# Patient Record
Sex: Female | Born: 1950
Health system: Southern US, Community
[De-identification: ages and names within clinical notes are randomized; demographics above are authoritative.]

## PROBLEM LIST (undated history)

## (undated) DIAGNOSIS — E119 Type 2 diabetes mellitus without complications: Secondary | ICD-10-CM

## (undated) HISTORY — DX: Type 2 diabetes mellitus without complications: E11.9

## (undated) HISTORY — PX: ANKLE SURGERY: SHX546

---

## 2000-01-13 ENCOUNTER — Emergency Department (HOSPITAL_COMMUNITY): Admission: EM | Admit: 2000-01-13 | Discharge: 2000-01-13 | Payer: Self-pay | Admitting: Emergency Medicine

## 2002-08-08 ENCOUNTER — Emergency Department (HOSPITAL_COMMUNITY): Admission: EM | Admit: 2002-08-08 | Discharge: 2002-08-08 | Payer: Self-pay | Admitting: Emergency Medicine

## 2003-06-05 ENCOUNTER — Emergency Department (HOSPITAL_COMMUNITY): Admission: EM | Admit: 2003-06-05 | Discharge: 2003-06-05 | Payer: Self-pay | Admitting: Emergency Medicine

## 2007-06-17 ENCOUNTER — Emergency Department (HOSPITAL_COMMUNITY): Admission: EM | Admit: 2007-06-17 | Discharge: 2007-06-17 | Payer: Self-pay | Admitting: Emergency Medicine

## 2009-01-04 ENCOUNTER — Ambulatory Visit: Payer: Self-pay | Admitting: Family Medicine

## 2010-09-06 ENCOUNTER — Ambulatory Visit: Payer: Self-pay | Admitting: Family Medicine

## 2013-07-08 ENCOUNTER — Ambulatory Visit: Payer: Self-pay | Admitting: Family Medicine

## 2013-09-02 ENCOUNTER — Ambulatory Visit: Payer: Self-pay | Admitting: Family Medicine

## 2014-04-21 LAB — HEMOGLOBIN A1C: Hemoglobin A1C: 7.2

## 2014-04-21 LAB — CBC AND DIFFERENTIAL
HEMATOCRIT: 41 % (ref 36–46)
HEMOGLOBIN: 13.4 g/dL (ref 12.0–16.0)
NEUTROS ABS: 36 /uL
PLATELETS: 186 10*3/uL (ref 150–399)
WBC: 10 10^3/mL

## 2014-04-21 LAB — LIPID PANEL
CHOLESTEROL: 182 mg/dL (ref 0–200)
HDL: 47 mg/dL (ref 35–70)
LDL CALC: 98 mg/dL
TRIGLYCERIDES: 185 mg/dL — AB (ref 40–160)

## 2014-04-21 LAB — HEPATIC FUNCTION PANEL
ALK PHOS: 86 U/L (ref 25–125)
ALT: 21 U/L (ref 7–35)
AST: 26 U/L (ref 13–35)
Bilirubin, Total: 0.5 mg/dL

## 2014-04-21 LAB — BASIC METABOLIC PANEL
BUN: 8 mg/dL (ref 4–21)
Creatinine: 0.8 mg/dL (ref 0.5–1.1)
Glucose: 131 mg/dL
Potassium: 5.2 mmol/L (ref 3.4–5.3)
SODIUM: 143 mmol/L (ref 137–147)

## 2014-09-02 IMAGING — CR DG CHEST 2V
1 series · 2 of 2 positions shown · non-contrast
Comparison: None.

CLINICAL DATA: Chest congestion .

EXAM:
CHEST  2 VIEW

[Series 1: pa · 0.17mm/px · 2 of 2 slices shown]
[im 1/2]
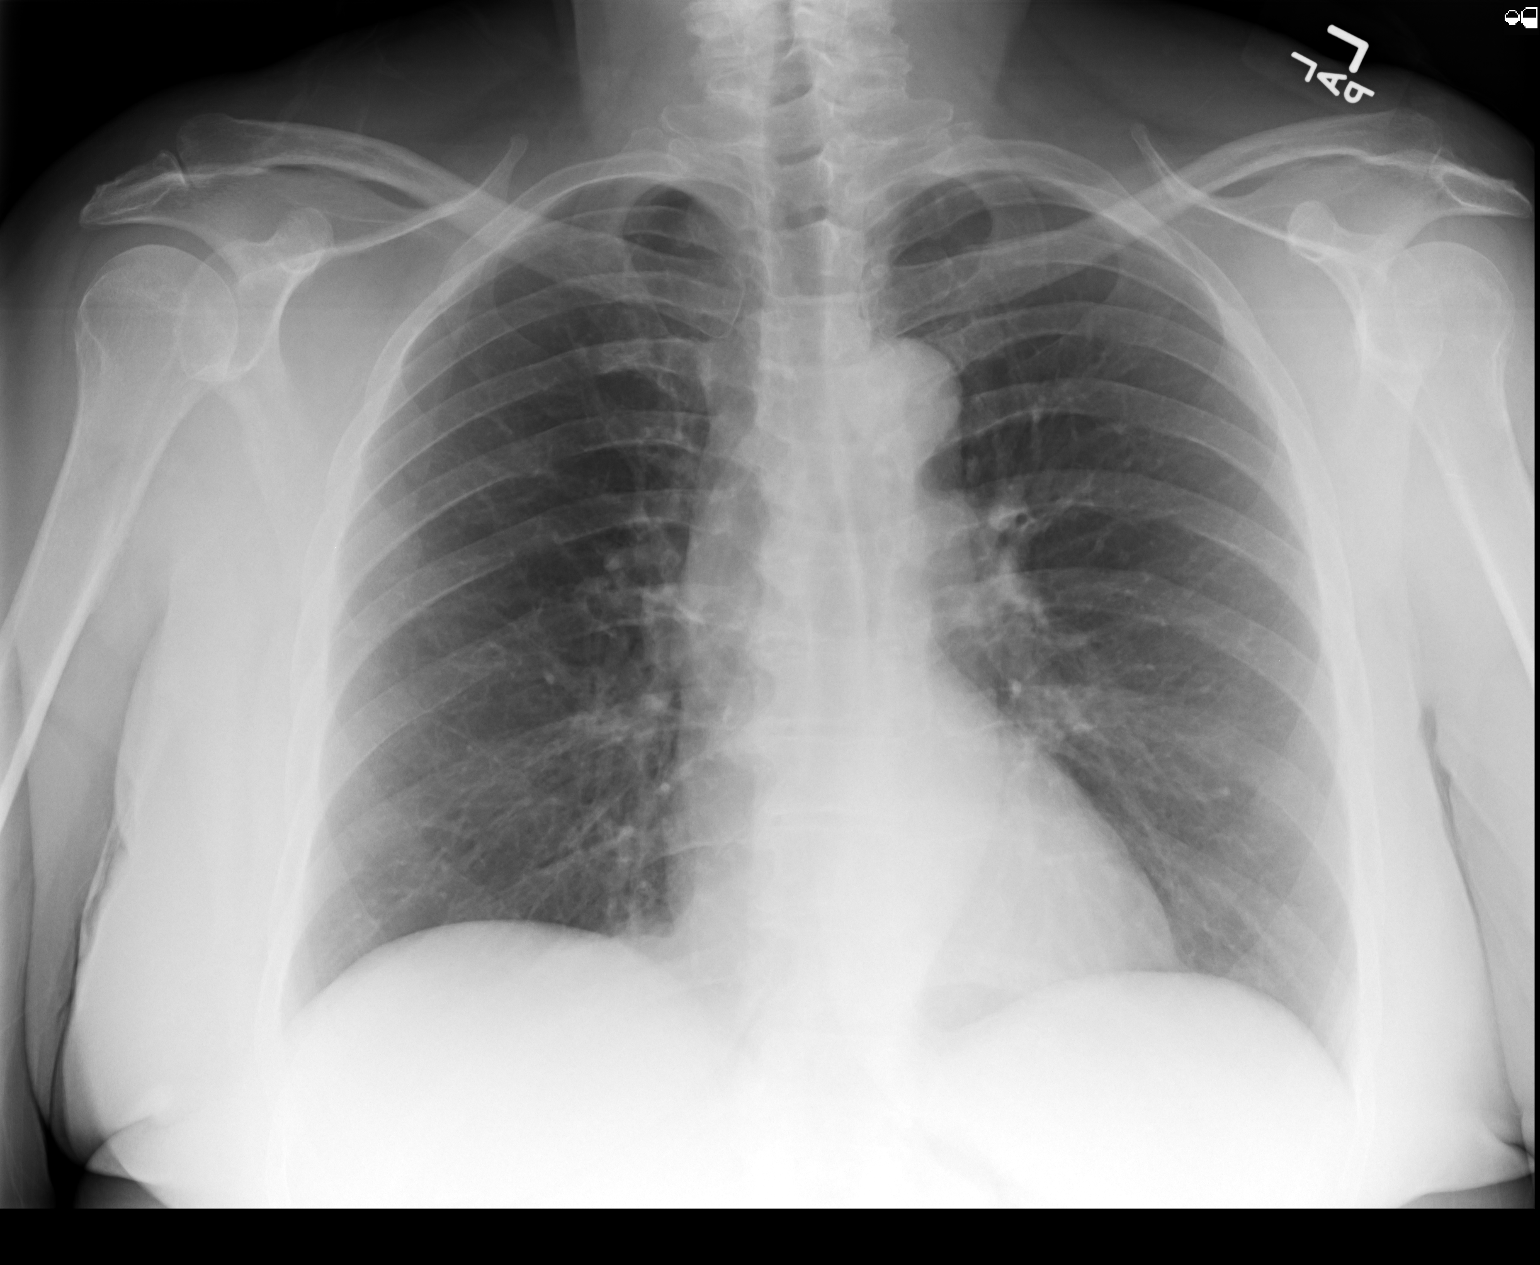
[im 2/2]
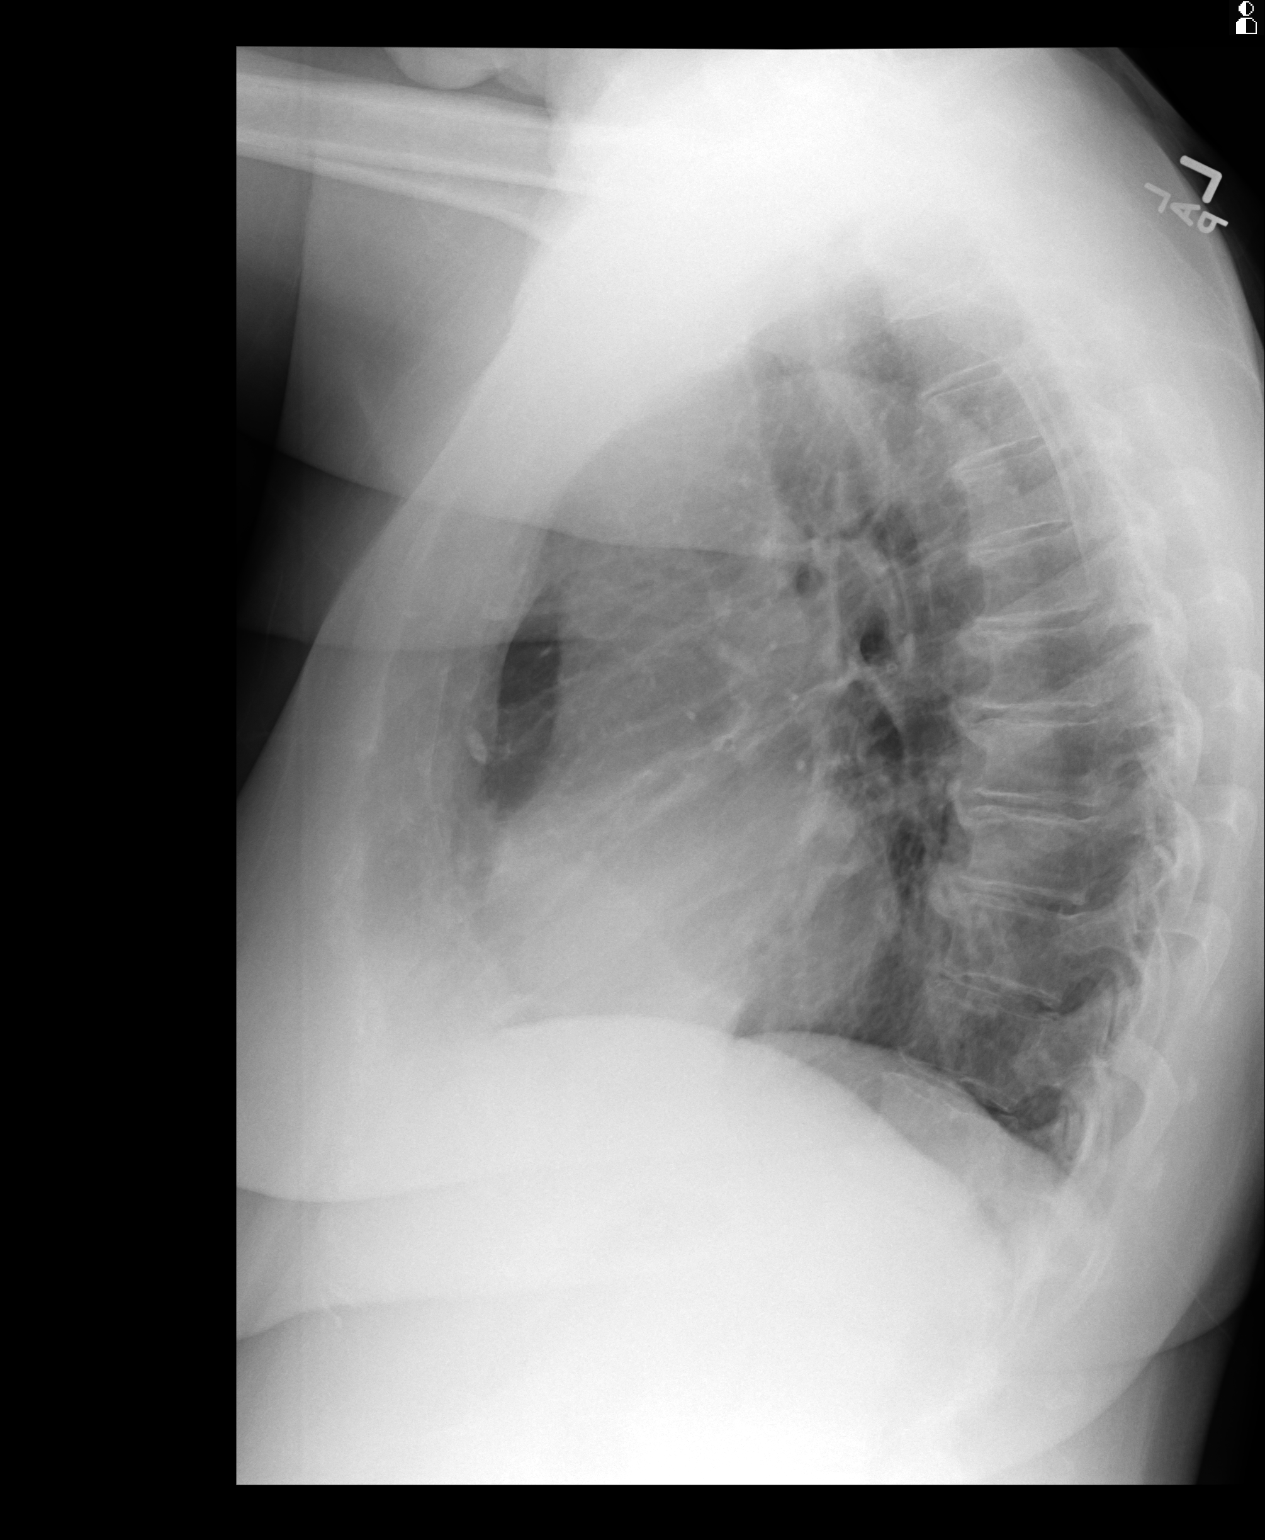

[2 of 2 positions shown; findings below may reference images not displayed]

FINDINGS: Mediastinum hilar structures normal. Lungs are clear. Heart size
normal. Degenerative changes thoracic spine .
IMPRESSION: No active cardiopulmonary disease.

## 2015-01-19 ENCOUNTER — Other Ambulatory Visit: Payer: Self-pay | Admitting: Family Medicine

## 2015-02-14 ENCOUNTER — Other Ambulatory Visit: Payer: Self-pay | Admitting: Family Medicine

## 2015-02-14 NOTE — Telephone Encounter (Signed)
Dennis patient 

## 2015-06-19 ENCOUNTER — Other Ambulatory Visit: Payer: Self-pay | Admitting: Family Medicine

## 2015-07-10 DIAGNOSIS — M62838 Other muscle spasm: Secondary | ICD-10-CM | POA: Insufficient documentation

## 2015-07-10 DIAGNOSIS — E785 Hyperlipidemia, unspecified: Secondary | ICD-10-CM | POA: Insufficient documentation

## 2015-07-10 DIAGNOSIS — M1990 Unspecified osteoarthritis, unspecified site: Secondary | ICD-10-CM | POA: Insufficient documentation

## 2015-07-10 DIAGNOSIS — E119 Type 2 diabetes mellitus without complications: Secondary | ICD-10-CM | POA: Insufficient documentation

## 2015-07-10 DIAGNOSIS — H9193 Unspecified hearing loss, bilateral: Secondary | ICD-10-CM | POA: Insufficient documentation

## 2015-07-11 ENCOUNTER — Encounter: Payer: Self-pay | Admitting: Family Medicine

## 2015-07-11 ENCOUNTER — Ambulatory Visit (INDEPENDENT_AMBULATORY_CARE_PROVIDER_SITE_OTHER): Payer: BLUE CROSS/BLUE SHIELD | Admitting: Family Medicine

## 2015-07-11 VITALS — BP 158/96 | HR 81 | Temp 98.7°F | Resp 16 | Wt 197.4 lb

## 2015-07-11 DIAGNOSIS — J209 Acute bronchitis, unspecified: Secondary | ICD-10-CM | POA: Diagnosis not present

## 2015-07-11 DIAGNOSIS — J069 Acute upper respiratory infection, unspecified: Secondary | ICD-10-CM | POA: Diagnosis not present

## 2015-07-11 DIAGNOSIS — J45909 Unspecified asthma, uncomplicated: Secondary | ICD-10-CM

## 2015-07-11 MED ORDER — CEFDINIR 300 MG PO CAPS: 300.0000 mg | ORAL_CAPSULE | Freq: Two times a day (BID) | ORAL | Status: DC

## 2015-07-11 MED ORDER — HYDROCODONE-HOMATROPINE 5-1.5 MG/5ML PO SYRP: 5.0000 mL | ORAL_SOLUTION | Freq: Three times a day (TID) | ORAL | Status: DC | PRN

## 2015-07-11 NOTE — Patient Instructions (Signed)
Upper Respiratory Infection, Adult Most upper respiratory infections (URIs) are a viral infection of the air passages leading to the lungs. A URI affects the nose, throat, and upper air passages. The most common type of URI is nasopharyngitis and is typically referred to as "the common cold." URIs run their course and usually go away on their own. Most of the time, a URI does not require medical attention, but sometimes a bacterial infection in the upper airways can follow a viral infection. This is called a secondary infection. Sinus and middle ear infections are common types of secondary upper respiratory infections. Bacterial pneumonia can also complicate a URI. A URI can worsen asthma and chronic obstructive pulmonary disease (COPD). Sometimes, these complications can require emergency medical care and may be life threatening.  CAUSES Almost all URIs are caused by viruses. A virus is a type of germ and can spread from one person to another.  RISKS FACTORS You may be at risk for a URI if:   You smoke.   You have chronic heart or lung disease.  You have a weakened defense (immune) system.   You are very young or very old.   You have nasal allergies or asthma.  You work in crowded or poorly ventilated areas.  You work in health care facilities or schools. SIGNS AND SYMPTOMS  Symptoms typically develop 2-3 days after you come in contact with a cold virus. Most viral URIs last 7-10 days. However, viral URIs from the influenza virus (flu virus) can last 14-18 days and are typically more severe. Symptoms may include:   Runny or stuffy (congested) nose.   Sneezing.   Cough.   Sore throat.   Headache.   Fatigue.   Fever.   Loss of appetite.   Pain in your forehead, behind your eyes, and over your cheekbones (sinus pain).  Muscle aches.  DIAGNOSIS  Your health care provider may diagnose a URI by:  Physical exam.  Tests to check that your symptoms are not due to  another condition such as:  Strep throat.  Sinusitis.  Pneumonia.  Asthma. TREATMENT  A URI goes away on its own with time. It cannot be cured with medicines, but medicines may be prescribed or recommended to relieve symptoms. Medicines may help:  Reduce your fever.  Reduce your cough.  Relieve nasal congestion. HOME CARE INSTRUCTIONS   Take medicines only as directed by your health care provider.   Gargle warm saltwater or take cough drops to comfort your throat as directed by your health care provider.  Use a warm mist humidifier or inhale steam from a shower to increase air moisture. This may make it easier to breathe.  Drink enough fluid to keep your urine clear or pale yellow.   Eat soups and other clear broths and maintain good nutrition.   Rest as needed.   Return to work when your temperature has returned to normal or as your health care provider advises. You may need to stay home longer to avoid infecting others. You can also use a face mask and careful hand washing to prevent spread of the virus.  Increase the usage of your inhaler if you have asthma.   Do not use any tobacco products, including cigarettes, chewing tobacco, or electronic cigarettes. If you need help quitting, ask your health care provider. PREVENTION  The best way to protect yourself from getting a cold is to practice good hygiene.   Avoid oral or hand contact with people with cold   symptoms.   Wash your hands often if contact occurs.  There is no clear evidence that vitamin C, vitamin E, echinacea, or exercise reduces the chance of developing a cold. However, it is always recommended to get plenty of rest, exercise, and practice good nutrition.  SEEK MEDICAL CARE IF:   You are getting worse rather than better.   Your symptoms are not controlled by medicine.   You have chills.  You have worsening shortness of breath.  You have brown or red mucus.  You have yellow or brown nasal  discharge.  You have pain in your face, especially when you bend forward.  You have a fever.  You have swollen neck glands.  You have pain while swallowing.  You have white areas in the back of your throat. SEEK IMMEDIATE MEDICAL CARE IF:   You have severe or persistent:  Headache.  Ear pain.  Sinus pain.  Chest pain.  You have chronic lung disease and any of the following:  Wheezing.  Prolonged cough.  Coughing up blood.  A change in your usual mucus.  You have a stiff neck.  You have changes in your:  Vision.  Hearing.  Thinking.  Mood. MAKE SURE YOU:   Understand these instructions.  Will watch your condition.  Will get help right away if you are not doing well or get worse.   This information is not intended to replace advice given to you by your health care provider. Make sure you discuss any questions you have with your health care provider.   Document Released: 10/30/2000 Document Revised: 09/20/2014 Document Reviewed: 08/11/2013 Elsevier Interactive Patient Education 2016 [employer] Inc[employer]  

## 2015-07-11 NOTE — Progress Notes (Signed)
Patient ID: Tammy Stevenson, female   DOB: [birth year], 65 y.o.   MRN: [id number]   Patient: Tammy Stevenson Female    DOB: Feb 04, 1951   65 y.o.   MRN: [id number] Visit Date: 07/11/2015  Today's Provider: Dortha Kern, PA   Chief Complaint  Patient presents with  . Cough   Subjective:    Cough This is a new problem. Episode onset: 10 days ago. The problem has been unchanged. The cough is productive of sputum (light yellow). Associated symptoms include shortness of breath and wheezing. Pertinent negatives include no chills, fever, nasal congestion, rhinorrhea or sore throat. She has tried OTC cough suppressant for the symptoms. The treatment provided no relief.   Patient Active Problem List   Diagnosis Date Noted  . Arthritis 07/10/2015  . Bilateral hearing loss 07/10/2015  . HLD (hyperlipidemia) 07/10/2015  . Cervical paraspinal muscle spasm 07/10/2015  . Type 2 diabetes mellitus (HCC) 07/10/2015   Past Surgical History  Procedure Laterality Date  . Ankle surgery Right    Family History  Problem Relation Age of Onset  . Heart disease Mother   . Stroke Mother   . Hypertension Father   . Lupus Sister      Previous Medications   ALBUTEROL (PROAIR HFA) 108 (90 BASE) MCG/ACT INHALER    Inhale into the lungs.   COLESEVELAM (WELCHOL) 625 MG TABLET    Take by mouth.   FLUTICASONE-SALMETEROL (ADVAIR DISKUS) 250-50 MCG/DOSE AEPB    Inhale into the lungs.   GLIPIZIDE (GLUCOTROL XL) 5 MG 24 HR TABLET    take 1 tablet by mouth once daily   GLUCOSE BLOOD (FREESTYLE LITE) TEST STRIP       METFORMIN (GLUCOPHAGE) 500 MG TABLET    take 1 tablet by mouth twice a day   Allergies  Allergen Reactions  . Azithromycin Hives and Rash    Review of Systems  Constitutional: Negative.  Negative for fever and chills.  HENT: Negative.  Negative for rhinorrhea and sore throat.   Eyes: Negative.   Respiratory: Positive for cough, shortness of breath and wheezing.   Cardiovascular: Negative.     Gastrointestinal: Negative.   Endocrine: Negative.   Genitourinary: Negative.   Musculoskeletal: Negative.   Skin: Negative.   Allergic/Immunologic: Negative.   Neurological: Negative.   Hematological: Negative.   Psychiatric/Behavioral: Negative.     Social History  Substance Use Topics  . Smoking status: Never Smoker   . Smokeless tobacco: Current User  . Alcohol Use: No   Objective:   BP 158/96 mmHg  Pulse 81  Temp(Src) 98.7 F (37.1 C) (Oral)  Resp 16  Wt 197 lb 6.4 oz (89.54 kg)  SpO2 94%  Physical Exam  Constitutional: She is oriented to person, place, and time. She appears well-developed and well-nourished. No distress.  HENT:  Head: Normocephalic and atraumatic.  Right Ear: Hearing and external ear normal.  Left Ear: Hearing and external ear normal.  Nose: Nose normal.  Severe hearing loss. Cobblestone appearance of posterior pharynx with slight redness.  Eyes: Conjunctivae and lids are normal. Right eye exhibits no discharge. Left eye exhibits no discharge. No scleral icterus.  Neck: Normal range of motion. Neck supple.  Cardiovascular: Normal rate, regular rhythm and normal heart sounds.   Pulmonary/Chest: Effort normal. No respiratory distress.  No wheeze at the present with slightly coarse breath sounds.  Abdominal: Soft. Bowel sounds are normal.  Musculoskeletal: Normal range of motion.  Neurological: She is alert and oriented to person, place, and  time.  Skin: Skin is intact. No lesion and no rash noted.  Psychiatric: She has a normal mood and affect. Her speech is normal and behavior is normal. Thought content normal.      Assessment & Plan:     1. Upper respiratory infection Onset over the past week with cough and congestion. No fever or sore throat. No relief from OTC Robitussin or Mucinex. Increase fluid intake and may use Tylenol prn aches or pains.  2. Subacute bronchitis with asthma Some slight wheeze intermittently with coarse breath sounds.  Should continue Advair regularly and ProAir-HFA prn rescue. Add Hycodan and Omnicef for bronchitis with slightly purulent sputum. Recheck in 1 week if no better. - HYDROcodone-homatropine (HYCODAN) 5-1.5 MG/5ML syrup; Take 5 mLs by mouth every 8 (eight) hours as needed for cough.  Dispense: 120 mL; Refill: 0 - cefdinir (OMNICEF) 300 MG capsule; Take 1 capsule (300 mg total) by mouth 2 (two) times daily.  Dispense: 20 capsule; Refill: 0

## 2015-07-12 ENCOUNTER — Other Ambulatory Visit: Payer: Self-pay | Admitting: Family Medicine

## 2016-03-21 ENCOUNTER — Other Ambulatory Visit: Payer: Self-pay | Admitting: Family Medicine

## 2016-07-16 ENCOUNTER — Ambulatory Visit (INDEPENDENT_AMBULATORY_CARE_PROVIDER_SITE_OTHER): Payer: BLUE CROSS/BLUE SHIELD | Admitting: Family Medicine

## 2016-07-16 ENCOUNTER — Encounter: Payer: Self-pay | Admitting: Family Medicine

## 2016-07-16 VITALS — BP 170/88 | HR 79 | Temp 98.0°F | Resp 16 | Wt 201.8 lb

## 2016-07-16 DIAGNOSIS — E785 Hyperlipidemia, unspecified: Secondary | ICD-10-CM | POA: Diagnosis not present

## 2016-07-16 DIAGNOSIS — Z114 Encounter for screening for human immunodeficiency virus [HIV]: Secondary | ICD-10-CM | POA: Diagnosis not present

## 2016-07-16 DIAGNOSIS — I10 Essential (primary) hypertension: Secondary | ICD-10-CM | POA: Diagnosis not present

## 2016-07-16 DIAGNOSIS — H9193 Unspecified hearing loss, bilateral: Secondary | ICD-10-CM | POA: Diagnosis not present

## 2016-07-16 DIAGNOSIS — E119 Type 2 diabetes mellitus without complications: Secondary | ICD-10-CM | POA: Diagnosis not present

## 2016-07-16 DIAGNOSIS — Z1159 Encounter for screening for other viral diseases: Secondary | ICD-10-CM

## 2016-07-16 LAB — POCT UA - MICROALBUMIN: Microalbumin Ur, POC: 50 mg/L

## 2016-07-16 LAB — POCT GLYCOSYLATED HEMOGLOBIN (HGB A1C): Hemoglobin A1C: 7.2

## 2016-07-16 MED ORDER — COLESEVELAM HCL 625 MG PO TABS: 1875.0000 mg | ORAL_TABLET | Freq: Two times a day (BID) | ORAL | 2 refills | Status: DC

## 2016-07-16 MED ORDER — LISINOPRIL 5 MG PO TABS: 5.0000 mg | ORAL_TABLET | Freq: Every day | ORAL | 3 refills | Status: DC

## 2016-07-16 NOTE — Progress Notes (Signed)
Patient: Tammy Stevenson Female    DOB: [birth year]   66 y.o.   MRN: [id number] Visit Date: 07/16/2016  Today's Provider: Dortha Kern, PA   Chief Complaint  Patient presents with  . Diabetes  . Hyperlipidemia  . Follow-up   Subjective:    HPI  Diabetes Mellitus Type II, Follow-up:   Lab Results  Component Value Date   HGBA1C 7.2 04/21/2014   Last seen for diabetes more than 1 years ago.  Management since then includes continue medication. She reports good compliance with treatment. She is not having side effects.  Current symptoms include none  Home blood sugar records: fasting range: low 100's  Episodes of hypoglycemia? no   Current Insulin Regimen: none Weight trend: stable Current diet: in general, a "healthy" diet   Current exercise: yard work when weather permits  ------------------------------------------------------------------------   Lipid/Cholesterol, Follow-up:   Last seen for this more than 1 years ago.  Management since that visit includes continue medication.  Last Lipid Panel:    Component Value Date/Time   CHOL 182 04/21/2014   TRIG 185 (A) 04/21/2014   HDL 47 04/21/2014   LDLCALC 98 04/21/2014    She reports good compliance with treatment. She is not having side effects.   Wt Readings from Last 3 Encounters:  07/16/16 201 lb 12.8 oz (91.5 kg)  07/11/15 197 lb 6.4 oz (89.5 kg)    ------------------------------------------------------------------------  Patient Active Problem List   Diagnosis Date Noted  . Arthritis 07/10/2015  . Bilateral hearing loss 07/10/2015  . HLD (hyperlipidemia) 07/10/2015  . Cervical paraspinal muscle spasm 07/10/2015  . Type 2 diabetes mellitus (HCC) 07/10/2015   Past Surgical History:  Procedure Laterality Date  . ANKLE SURGERY Right    Family History  Problem Relation Age of Onset  . Heart disease Mother   . Stroke Mother   . Hypertension Father   . Lupus Sister    Allergies  Allergen  Reactions  . Azithromycin Hives and Rash     Previous Medications   ALBUTEROL (PROAIR HFA) 108 (90 BASE) MCG/ACT INHALER    Inhale into the lungs.   FLUTICASONE-SALMETEROL (ADVAIR DISKUS) 250-50 MCG/DOSE AEPB    Inhale into the lungs.   GLIPIZIDE (GLUCOTROL XL) 5 MG 24 HR TABLET    take 1 tablet by mouth once daily   GLUCOSE BLOOD (FREESTYLE LITE) TEST STRIP       METFORMIN (GLUCOPHAGE) 500 MG TABLET    take 1 tablet by mouth twice a day   WELCHOL 625 MG TABLET    TAKE 3 TABLETS TWICE A DAY    Review of Systems  Constitutional: Negative.   Respiratory: Negative.   Cardiovascular: Negative.   Musculoskeletal: Negative.     Social History  Substance Use Topics  . Smoking status: Never Smoker  . Smokeless tobacco: Current User  . Alcohol use No   Objective:   BP (!) 170/88 (BP Location: Right Arm, Patient Position: Sitting, Cuff Size: Normal)   Pulse 79   Temp 98 F (36.7 C) (Oral)   Resp 16   Wt 201 lb 12.8 oz (91.5 kg)   SpO2 95%   Physical Exam  Constitutional: She is oriented to person, place, and time. She appears well-developed and well-nourished. No distress.  HENT:  Head: Atraumatic.  Right Ear: External ear and ear canal normal.  Left Ear: External ear and ear canal normal.  Nose: Nose normal.  Bilateral severe hearing loss. Wears only one hearing aid in the  left ear.  Eyes: Conjunctivae and lids are normal. Right eye exhibits no discharge. Left eye exhibits no discharge. No scleral icterus.  Neck: Neck supple. No thyromegaly present.  Cardiovascular: Normal rate and regular rhythm.   Pulmonary/Chest: Effort normal and breath sounds normal. No respiratory distress.  Abdominal: Soft. Bowel sounds are normal.  Musculoskeletal: Normal range of motion.  Lymphadenopathy:    She has no cervical adenopathy.  Neurological: She is alert and oriented to person, place, and time.  Normal sensation in feet when tested with monofilament.  Skin: Skin is intact. No lesion  and no rash noted.  Psychiatric: She has a normal mood and affect. Her speech is normal and behavior is normal. Thought content normal.    Depression screen [facility] 07/16/2016  Decreased Interest 0  Down, Depressed, Hopeless 0  PHQ - 2 Score 0  Altered sleeping 0  Tired, decreased energy 3  Change in appetite 0  Feeling bad or failure about yourself  0  Trouble concentrating 0  Moving slowly or fidgety/restless 0  Suicidal thoughts 0  PHQ-9 Score 3       Assessment & Plan:     1. Type 2 diabetes mellitus without complication, without long-term current use of insulin (HCC) Rarely checks FBS. Hgb A1C was 7.2% today and on 04-21-14 (last check). Still using the Glipizide 5 mg qd and Metformin 500 mg BID with Welchol BID. Urine microalbumin was up to 50 mg/L today. Denies polyuria, polydipsia but not significant vision changes. Refuses flu shot, mammograms or colonoscopy. Recheck pending lab reports. - POCT HgB A1C - POCT UA - Microalbumin - CBC with Differential/Platelet - Comprehensive metabolic panel - Lipid panel  2. Hyperlipidemia, unspecified hyperlipidemia type Has not been using the Welchol routinely and question compliance with diabetic low fat diet. States she needs to lose some weight and feel she could when warmer weather comes so she can get "outdoors". Revill Welchol and check routine labs. - CBC with Differential/Platelet - Comprehensive metabolic panel - TSH - colesevelam (WELCHOL) 625 MG tablet; Take 3 tablets (1,875 mg total) by mouth 2 (two) times daily.  Dispense: 540 tablet; Refill: 2 - Lipid panel  3. Bilateral hearing loss, unspecified hearing loss type Severe hearing loss with some help from a hearing aid in the left ear. Won't wear them bilaterally. No drainage or signs of infection. No cerumen impactions.  4. Essential hypertension BP elevated to 170/88 today. With microalbuminuria of 50 mg/L today, recommend Lisinopril 5 mg qd and routine labs. Follow up  BP in 2-3 months or sooner pending lab reports. - CBC with Differential/Platelet - Comprehensive metabolic panel - TSH - lisinopril (PRINIVIL,ZESTRIL) 5 MG tablet; Take 1 tablet (5 mg total) by mouth daily.  Dispense: 90 tablet; Refill: 3  5. Encounter for screening for HIV - HIV antibody  6. Need for hepatitis C screening test - Hepatitis C antibody

## 2016-07-17 LAB — COMPREHENSIVE METABOLIC PANEL
A/G RATIO: 1.9 (ref 1.2–2.2)
ALT: 18 IU/L (ref 0–32)
AST: 18 IU/L (ref 0–40)
Albumin: 4.4 g/dL (ref 3.6–4.8)
Alkaline Phosphatase: 72 IU/L (ref 39–117)
BUN/Creatinine Ratio: 11 — ABNORMAL LOW (ref 12–28)
BUN: 8 mg/dL (ref 8–27)
Bilirubin Total: 0.6 mg/dL (ref 0.0–1.2)
CALCIUM: 9.3 mg/dL (ref 8.7–10.3)
CO2: 28 mmol/L (ref 18–29)
CREATININE: 0.76 mg/dL (ref 0.57–1.00)
Chloride: 102 mmol/L (ref 96–106)
GFR calc Af Amer: 96 mL/min/{1.73_m2} (ref >59)
GFR, EST NON AFRICAN AMERICAN: 83 mL/min/{1.73_m2} (ref >59)
GLUCOSE: 145 mg/dL — AB (ref 65–99)
Globulin, Total: 2.3 g/dL (ref 1.5–4.5)
POTASSIUM: 5 mmol/L (ref 3.5–5.2)
Sodium: 143 mmol/L (ref 134–144)
Total Protein: 6.7 g/dL (ref 6.0–8.5)

## 2016-07-17 LAB — LIPID PANEL
CHOLESTEROL TOTAL: 198 mg/dL (ref 100–199)
Chol/HDL Ratio: 4 ratio units (ref 0.0–4.4)
HDL: 49 mg/dL (ref >39)
LDL Calculated: 117 mg/dL — ABNORMAL HIGH (ref 0–99)
Triglycerides: 162 mg/dL — ABNORMAL HIGH (ref 0–149)
VLDL Cholesterol Cal: 32 mg/dL (ref 5–40)

## 2016-07-17 LAB — CBC WITH DIFFERENTIAL/PLATELET
BASOS ABS: 0 10*3/uL (ref 0.0–0.2)
Basos: 0 %
EOS (ABSOLUTE): 0.1 10*3/uL (ref 0.0–0.4)
Eos: 1 %
Hematocrit: 41.4 % (ref 34.0–46.6)
Hemoglobin: 13.3 g/dL (ref 11.1–15.9)
IMMATURE GRANS (ABS): 0 10*3/uL (ref 0.0–0.1)
IMMATURE GRANULOCYTES: 0 %
LYMPHS: 58 %
Lymphocytes Absolute: 5.1 10*3/uL — ABNORMAL HIGH (ref 0.7–3.1)
MCH: 27.8 pg (ref 26.6–33.0)
MCHC: 32.1 g/dL (ref 31.5–35.7)
MCV: 86 fL (ref 79–97)
MONOS ABS: 0.3 10*3/uL (ref 0.1–0.9)
Monocytes: 3 %
NEUTROS PCT: 38 %
Neutrophils Absolute: 3.4 10*3/uL (ref 1.4–7.0)
PLATELETS: 169 10*3/uL (ref 150–379)
RBC: 4.79 x10E6/uL (ref 3.77–5.28)
RDW: 15.1 % (ref 12.3–15.4)
WBC: 8.9 10*3/uL (ref 3.4–10.8)

## 2016-07-17 LAB — HIV ANTIBODY (ROUTINE TESTING W REFLEX): HIV SCREEN 4TH GENERATION: NONREACTIVE

## 2016-07-17 LAB — HEMOGLOBIN A1C
Est. average glucose Bld gHb Est-mCnc: 151 mg/dL
HEMOGLOBIN A1C: 6.9 % — AB (ref 4.8–5.6)

## 2016-07-17 LAB — HEPATITIS C ANTIBODY: Hep C Virus Ab: <0.1 s/co ratio (ref 0.0–0.9)

## 2016-07-17 LAB — TSH: TSH: 2.65 u[IU]/mL (ref 0.450–4.500)

## 2016-07-22 ENCOUNTER — Telehealth: Payer: Self-pay | Admitting: Family Medicine

## 2016-07-22 NOTE — Telephone Encounter (Signed)
Be sure patient received lab reports from 07-16-16.

## 2016-07-23 NOTE — Telephone Encounter (Signed)
Pt's daughter advised; [facility]. Allene DillonEmily Drozdowski, CMA

## 2016-07-23 NOTE — Telephone Encounter (Signed)
LMTCB

## 2016-09-21 ENCOUNTER — Other Ambulatory Visit: Payer: Self-pay | Admitting: Family Medicine

## 2016-09-21 NOTE — Telephone Encounter (Signed)
Will refill glipizide and metformin for 90 days. Remind patient she is due for recheck of diabetes, blood pressure and cholesterol in 1-2 months.

## 2016-09-23 NOTE — Telephone Encounter (Signed)
Patient has a follow up appointment scheduled for 10/17/2016

## 2016-10-16 DIAGNOSIS — I10 Essential (primary) hypertension: Secondary | ICD-10-CM | POA: Insufficient documentation

## 2016-10-17 ENCOUNTER — Ambulatory Visit (INDEPENDENT_AMBULATORY_CARE_PROVIDER_SITE_OTHER): Payer: BLUE CROSS/BLUE SHIELD | Admitting: Family Medicine

## 2016-10-17 ENCOUNTER — Encounter: Payer: Self-pay | Admitting: Family Medicine

## 2016-10-17 VITALS — BP 148/72 | HR 68 | Temp 98.1°F | Resp 16 | Wt 187.0 lb

## 2016-10-17 DIAGNOSIS — I10 Essential (primary) hypertension: Secondary | ICD-10-CM

## 2016-10-17 DIAGNOSIS — E119 Type 2 diabetes mellitus without complications: Secondary | ICD-10-CM | POA: Diagnosis not present

## 2016-10-17 NOTE — Progress Notes (Signed)
Patient: Tammy Stevenson Female    DOB: 01-26-1951   66 y.o.   MRN: [id number] Visit Date: 10/17/2016  Today's Provider: Dortha Kern, PA   Chief Complaint  Patient presents with  . Hypertension  . Diabetes   Subjective:    HPI  Hypertension, follow-up:  BP Readings from Last 3 Encounters:  10/17/16 (!) 148/72  07/16/16 (!) 170/88  07/11/15 (!) 158/96    She was last seen for hypertension 3 months ago.  BP at that visit was 170/88. Management since that visit includes adding Lisinopril 5mg  daily. She reports good compliance with treatment. She is not having side effects.  She is exercising. She is adherent to low salt diet.   Outside blood pressures are not being checked. Patient denies exertional chest pressure/discomfort and palpitations.   Cardiovascular risk factors include diabetes mellitus.     Weight trend: stable Wt Readings from Last 3 Encounters:  10/17/16 187 lb (84.8 kg)  07/16/16 201 lb 12.8 oz (91.5 kg)  07/11/15 197 lb 6.4 oz (89.5 kg)    Current diet: well balanced    Diabetes Mellitus Type II, Follow-up:   Lab Results  Component Value Date   HGBA1C 6.9 (H) 07/16/2016   HGBA1C 7.2 07/16/2016   HGBA1C 7.2 04/21/2014    Last seen for diabetes 3 months ago.  Management since then includes adding Lisinopril for kidney protection. She reports good compliance with treatment. She is not having side effects.  Current symptoms include none and have been stable. Home blood sugar records: trend: stable  Episodes of hypoglycemia? no   Current Insulin Regimen: none Weight trend: decreasing steadily Prior visit with dietician: no Current diet: well balanced Current exercise: walking  Pertinent Labs:    Component Value Date/Time   CHOL 198 07/16/2016 0953   TRIG 162 (H) 07/16/2016 0953   HDL 49 07/16/2016 0953   LDLCALC 117 (H) 07/16/2016 0953   CREATININE 0.76 07/16/2016 0953    Wt Readings from Last 3 Encounters:  10/17/16  187 lb (84.8 kg)  07/16/16 201 lb 12.8 oz (91.5 kg)  07/11/15 197 lb 6.4 oz (89.5 kg)   Patient Active Problem List   Diagnosis Date Noted  . Essential hypertension 10/16/2016  . Arthritis 07/10/2015  . Bilateral hearing loss 07/10/2015  . HLD (hyperlipidemia) 07/10/2015  . Cervical paraspinal muscle spasm 07/10/2015  . Type 2 diabetes mellitus (HCC) 07/10/2015   Past Surgical History:  Procedure Laterality Date  . ANKLE SURGERY Right    Family History  Problem Relation Age of Onset  . Heart disease Mother   . Stroke Mother   . Hypertension Father   . Lupus Sister      Allergies  Allergen Reactions  . Azithromycin Hives and Rash    Current Outpatient Prescriptions:  .  albuterol (PROAIR HFA) 108 (90 Base) MCG/ACT inhaler, Inhale into the lungs., Disp: , Rfl:  .  colesevelam (WELCHOL) 625 MG tablet, Take 3 tablets (1,875 mg total) by mouth 2 (two) times daily., Disp: 540 tablet, Rfl: 2 .  Fluticasone-Salmeterol (ADVAIR DISKUS) 250-50 MCG/DOSE AEPB, Inhale into the lungs., Disp: , Rfl:  .  glipiZIDE (GLUCOTROL XL) 5 MG 24 hr tablet, TAKE 1 TABLET DAILY, Disp: 90 tablet, Rfl: 4 .  glucose blood (FREESTYLE LITE) test strip, , Disp: , Rfl:  .  lisinopril (PRINIVIL,ZESTRIL) 5 MG tablet, Take 1 tablet (5 mg total) by mouth daily., Disp: 90 tablet, Rfl: 3 .  metFORMIN (GLUCOPHAGE) 500 MG  tablet, TAKE 1 TABLET TWICE A DAY, Disp: 180 tablet, Rfl: 4  Review of Systems  Constitutional: Negative.   Respiratory: Negative.   Cardiovascular: Negative.   Musculoskeletal: Negative.   Neurological: Negative.     Social History  Substance Use Topics  . Smoking status: Never Smoker  . Smokeless tobacco: Current User  . Alcohol use No   Objective:   BP (!) 148/72 (BP Location: Right Arm, Patient Position: Sitting, Cuff Size: Normal)   Pulse 68   Temp 98.1 F (36.7 C)   Resp 16   Wt 187 lb (84.8 kg)   SpO2 95%  Vitals:   10/17/16 0837  BP: (!) 148/72  Pulse: 68  Resp: 16    Temp: 98.1 F (36.7 C)  SpO2: 95%  Weight: 187 lb (84.8 kg)     Physical Exam  Constitutional: She is oriented to person, place, and time. She appears well-developed and well-nourished. No distress.  HENT:  Head: Normocephalic and atraumatic.  Right Ear: Hearing normal.  Left Ear: Hearing normal.  Nose: Nose normal.  Significant hearing loss.  Eyes: Conjunctivae and lids are normal. Right eye exhibits no discharge. Left eye exhibits no discharge. No scleral icterus.  Cardiovascular: Normal rate and regular rhythm.   Pulmonary/Chest: Effort normal and breath sounds normal. No respiratory distress.  Abdominal: Soft. Bowel sounds are normal.  Musculoskeletal: Normal range of motion.  Neurological: She is alert and oriented to person, place, and time.  Skin: Skin is intact. No lesion and no rash noted.  Psychiatric: She has a normal mood and affect. Her speech is normal and behavior is normal. Thought content normal.      Assessment & Plan:     1. Essential hypertension Improved BP. Tolerating the Lisinopril. Will recheck labs and follow up in 3 months. - Lipid panel - Comprehensive metabolic panel - CBC with Differential/Platelet  2. Type 2 diabetes mellitus without complication, without long-term current use of insulin (HCC) Continues Glipizide 5 mg qd with Metformin 500 mg BID and Welchol BID for cholesterol and diabetes control. Had 2 episodes of hypoglycemia since February. Has lost 14 lbs since last office visit. Enjoys working outdoors which has increased her exercise level. Continue to check FBS each morning and use protein snack to maintain stable blood sugar. If having more often, may need to decrease Metformin or stop Glipizide. Recheck in 3 months. - Lipid panel - Comprehensive metabolic panel - CBC with Differential/Platelet        Dortha Kernennis Chrismon, PA  [facility]

## 2016-10-18 ENCOUNTER — Telehealth: Payer: Self-pay

## 2016-10-18 LAB — COMPREHENSIVE METABOLIC PANEL
ALBUMIN: 4.3 g/dL (ref 3.6–4.8)
ALK PHOS: 73 IU/L (ref 39–117)
ALT: 15 IU/L (ref 0–32)
AST: 23 IU/L (ref 0–40)
Albumin/Globulin Ratio: 2 (ref 1.2–2.2)
BUN/Creatinine Ratio: 8 — ABNORMAL LOW (ref 12–28)
BUN: 7 mg/dL — AB (ref 8–27)
Bilirubin Total: 0.5 mg/dL (ref 0.0–1.2)
CALCIUM: 9.4 mg/dL (ref 8.7–10.3)
CO2: 25 mmol/L (ref 18–29)
CREATININE: 0.83 mg/dL (ref 0.57–1.00)
Chloride: 108 mmol/L — ABNORMAL HIGH (ref 96–106)
GFR calc Af Amer: 86 mL/min/{1.73_m2} (ref >59)
GFR, EST NON AFRICAN AMERICAN: 75 mL/min/{1.73_m2} (ref >59)
GLOBULIN, TOTAL: 2.1 g/dL (ref 1.5–4.5)
GLUCOSE: 123 mg/dL — AB (ref 65–99)
Potassium: 4.9 mmol/L (ref 3.5–5.2)
SODIUM: 144 mmol/L (ref 134–144)
Total Protein: 6.4 g/dL (ref 6.0–8.5)

## 2016-10-18 LAB — CBC WITH DIFFERENTIAL/PLATELET
BASOS: 0 %
Basophils Absolute: 0 10*3/uL (ref 0.0–0.2)
EOS (ABSOLUTE): 0.1 10*3/uL (ref 0.0–0.4)
EOS: 1 %
HEMATOCRIT: 40.8 % (ref 34.0–46.6)
HEMOGLOBIN: 13 g/dL (ref 11.1–15.9)
IMMATURE GRANS (ABS): 0 10*3/uL (ref 0.0–0.1)
IMMATURE GRANULOCYTES: 0 %
Lymphocytes Absolute: 6.2 10*3/uL — ABNORMAL HIGH (ref 0.7–3.1)
Lymphs: 61 %
MCH: 28.1 pg (ref 26.6–33.0)
MCHC: 31.9 g/dL (ref 31.5–35.7)
MCV: 88 fL (ref 79–97)
MONOCYTES: 4 %
Monocytes Absolute: 0.4 10*3/uL (ref 0.1–0.9)
NEUTROS PCT: 34 %
Neutrophils Absolute: 3.5 10*3/uL (ref 1.4–7.0)
Platelets: 185 10*3/uL (ref 150–379)
RBC: 4.62 x10E6/uL (ref 3.77–5.28)
RDW: 15.3 % (ref 12.3–15.4)
WBC: 10.3 10*3/uL (ref 3.4–10.8)

## 2016-10-18 LAB — LIPID PANEL
CHOLESTEROL TOTAL: 166 mg/dL (ref 100–199)
Chol/HDL Ratio: 3.3 ratio (ref 0.0–4.4)
HDL: 51 mg/dL (ref >39)
LDL CALC: 91 mg/dL (ref 0–99)
Triglycerides: 120 mg/dL (ref 0–149)
VLDL CHOLESTEROL CAL: 24 mg/dL (ref 5–40)

## 2016-10-18 NOTE — Telephone Encounter (Signed)
-----   Message from Tamsen Roersennis E Chrismon, [place] sent at 10/18/2016  4:18 PM EDT ----- Cholesterol and triglycerides in perfect range. Blood sugar in good shape and normal kidney function. No signs of anemia or infections on blood cell counts. Continue present diet, exercise and medications. Recheck progress in 3 months. Continue to check FBS daily and monitor for hypoglycemia.

## 2016-10-18 NOTE — Telephone Encounter (Signed)
Patient's daughter [name] advised.

## 2017-01-16 ENCOUNTER — Encounter: Payer: Self-pay | Admitting: Family Medicine

## 2017-01-16 ENCOUNTER — Ambulatory Visit (INDEPENDENT_AMBULATORY_CARE_PROVIDER_SITE_OTHER): Payer: BLUE CROSS/BLUE SHIELD | Admitting: Family Medicine

## 2017-01-16 VITALS — BP 138/72 | HR 72 | Temp 97.7°F | Ht 64.0 in | Wt 184.8 lb

## 2017-01-16 DIAGNOSIS — E119 Type 2 diabetes mellitus without complications: Secondary | ICD-10-CM

## 2017-01-16 DIAGNOSIS — T464X5A Adverse effect of angiotensin-converting-enzyme inhibitors, initial encounter: Secondary | ICD-10-CM | POA: Diagnosis not present

## 2017-01-16 DIAGNOSIS — H9193 Unspecified hearing loss, bilateral: Secondary | ICD-10-CM | POA: Diagnosis not present

## 2017-01-16 DIAGNOSIS — I10 Essential (primary) hypertension: Secondary | ICD-10-CM

## 2017-01-16 DIAGNOSIS — E785 Hyperlipidemia, unspecified: Secondary | ICD-10-CM | POA: Diagnosis not present

## 2017-01-16 DIAGNOSIS — Z23 Encounter for immunization: Secondary | ICD-10-CM | POA: Diagnosis not present

## 2017-01-16 DIAGNOSIS — R05 Cough: Secondary | ICD-10-CM | POA: Diagnosis not present

## 2017-01-16 MED ORDER — GLUCOSE BLOOD VI STRP
ORAL_STRIP | 3 refills | Status: DC
Start: 2017-01-16 — End: 2022-05-27

## 2017-01-16 NOTE — Progress Notes (Signed)
Patient: Tammy Stevenson Female    DOB: 1951/01/12   66 y.o.   MRN: [id number] Visit Date: 01/16/2017  Today's Provider: Dortha Kern, PA   Chief Complaint  Patient presents with  . Hypertension  . Hyperlipidemia  . Diabetes  . Follow-up   Subjective:    HPI  Hypertension, follow-up:     BP Readings from Last 3 Encounters:  10/17/16 (!) 148/72  07/16/16 (!) 170/88  07/11/15 (!) 158/96    She was last seen for hypertension 3 months ago.  BP at that visit was 148/72. Management since that visit includes continue Lisinopril 5mg  daily. She reports good compliance with treatment. She is not having side effects.  She is exercising. She is adherent to low salt diet.   Outside blood pressures are not being checked. Patient denies exertional chest pressure/discomfort and palpitations.   Cardiovascular risk factors include diabetes mellitus.                Weight trend: stable    Wt Readings from Last 3 Encounters:  10/17/16 187 lb (84.8 kg)  07/16/16 201 lb 12.8 oz (91.5 kg)  07/11/15 197 lb 6.4 oz (89.5 kg)    Current diet: well balanced    Diabetes Mellitus Type II, Follow-up:   Lab Results  Component Value Date   HGBA1C 6.9 (H) 07/16/2016        Last seen for diabetes 3 months ago.  Management since then includes continue Lisinopril for kidney protection, check FBS and monitor for hypoglycemic episodes. She reports good compliance with treatment. She is not having side effects.  Current symptoms include none and have been stable. Home blood sugar records: trend: stable in the 100 range  Episodes of hypoglycemia? Yes a few times              Current Insulin Regimen: none Weight trend: decreasing steadily Prior visit with dietician: no Current diet: well balanced Current exercise: walking  Pertinent Labs: Labs(Brief)          Component Value Date/Time   CHOL 198 07/16/2016 0953   TRIG 162 (H) 07/16/2016 0953   HDL 49  07/16/2016 0953   LDLCALC 117 (H) 07/16/2016 0953   CREATININE 0.76 07/16/2016 0953         Wt Readings from Last 3 Encounters:  10/17/16 187 lb (84.8 kg)  07/16/16 201 lb 12.8 oz (91.5 kg)  07/11/15 197 lb 6.4 oz (89.5 kg)   Patient Active Problem List   Diagnosis Date Noted  . Essential hypertension 10/16/2016  . Arthritis 07/10/2015  . Bilateral hearing loss 07/10/2015  . HLD (hyperlipidemia) 07/10/2015  . Cervical paraspinal muscle spasm 07/10/2015  . Type 2 diabetes mellitus (HCC) 07/10/2015   Past Surgical History:  Procedure Laterality Date  . ANKLE SURGERY Right    Family History  Problem Relation Age of Onset  . Heart disease Mother   . Stroke Mother   . Hypertension Father   . Lupus Sister    Allergies  Allergen Reactions  . Azithromycin Hives and Rash   Previous Medications   ALBUTEROL (PROAIR HFA) 108 (90 BASE) MCG/ACT INHALER    Inhale into the lungs.   COLESEVELAM (WELCHOL) 625 MG TABLET    Take 3 tablets (1,875 mg total) by mouth 2 (two) times daily.   FLUTICASONE-SALMETEROL (ADVAIR DISKUS) 250-50 MCG/DOSE AEPB    Inhale into the lungs.   GLIPIZIDE (GLUCOTROL XL) 5 MG 24 HR TABLET    TAKE 1 TABLET DAILY  GLUCOSE BLOOD (FREESTYLE LITE) TEST STRIP       LISINOPRIL (PRINIVIL,ZESTRIL) 5 MG TABLET    Take 1 tablet (5 mg total) by mouth daily.   METFORMIN (GLUCOPHAGE) 500 MG TABLET    TAKE 1 TABLET TWICE A DAY    Review of Systems  Constitutional: Negative.   Respiratory: Positive for cough.   Cardiovascular: Negative.   Endocrine: Negative.   Musculoskeletal: Negative.     Social History  Substance Use Topics  . Smoking status: Never Smoker  . Smokeless tobacco: Current User  . Alcohol use No   Objective:   BP 138/72 (BP Location: Right Arm, Patient Position: Sitting, Cuff Size: Normal)   Pulse 72   Temp 97.7 F (36.5 C) (Oral)   Ht 5\' 4"  (1.626 m)   Wt 184 lb 12.8 oz (83.8 kg)   SpO2 97%   BMI 31.72 kg/m   Physical Exam    Constitutional: She is oriented to person, place, and time. She appears well-developed and well-nourished. No distress.  HENT:  Head: Normocephalic and atraumatic.  Right Ear: Hearing and external ear normal.  Left Ear: Hearing and external ear normal.  Nose: Nose normal.  Mouth/Throat: Oropharynx is clear and moist.  Significant hearing loss and uses an aid in the left ear.  Eyes: Conjunctivae and lids are normal. Right eye exhibits no discharge. Left eye exhibits no discharge. No scleral icterus.  Neck: Neck supple.  Cardiovascular: Normal rate and regular rhythm.   Pulmonary/Chest: Effort normal and breath sounds normal. No respiratory distress.  Abdominal: Soft. Bowel sounds are normal.  Musculoskeletal: Normal range of motion.  Lymphadenopathy:    She has no cervical adenopathy.  Neurological: She is alert and oriented to person, place, and time.  Skin: Skin is intact. No lesion and no rash noted.  Psychiatric: She has a normal mood and affect. Her speech is normal and behavior is normal. Thought content normal.      Assessment & Plan:     1. Type 2 diabetes mellitus without complication, without long-term current use of insulin (HCC) FBS in the 100 range at home and Hgb A1C was 6.9% on 07-17-16. Normal foot exam and urine microalbumin 50 mg/L on 07-16-16. With chronic cough, will stop the Lisinopril and consider ARB. Continue Metformin 500 mg BID, Glipizide 5 mg qd with Welchol 625 mg 3 tablets BID. Refill glucometer test strips and test FBS daily. Get follow up labs and recheck pending reports. - glucose blood (FREESTYLE LITE) test strip; Test blood sugar once a day before breakfast.  Dispense: 100 each; Refill: 3 - CBC with Differential/Platelet - Comprehensive metabolic panel - Hemoglobin A1c  2. Essential hypertension Normal BP today. Having a chronic cough and will stop the Lisinopril to see if this controls the cough. Recheck CBC, CMP, Lipids and TSH.  - CBC with  Differential/Platelet - Comprehensive metabolic panel - Lipid panel - TSH  3. Bilateral hearing loss, unspecified hearing loss type Uses only one hearing aid in the left ear. No signs of TM perforations or infection. No cerumen impactions.  4. Hyperlipidemia, unspecified hyperlipidemia type Tolerating Welchol without side effects. Will check labs and follow up pending reports. - Comprehensive metabolic panel - Lipid panel - TSH  5. Cough due to ACE inhibitor Hacking cough without congestion or fever. Has been present for several weeks. Will stop Lisinopril to see if this is the cause.  6. Need for diphtheria, tetanus, pertussis, and Hib vaccination - Tdap vaccine greater than or  equal to 7yo IM  7. Need for Streptococcus pneumoniae vaccination - Pneumococcal conjugate vaccine 13-valent IM

## 2017-01-17 ENCOUNTER — Telehealth: Payer: Self-pay

## 2017-01-17 LAB — LIPID PANEL
CHOLESTEROL TOTAL: 161 mg/dL (ref 100–199)
Chol/HDL Ratio: 3.2 ratio (ref 0.0–4.4)
HDL: 51 mg/dL (ref >39)
LDL Calculated: 77 mg/dL (ref 0–99)
TRIGLYCERIDES: 165 mg/dL — AB (ref 0–149)
VLDL Cholesterol Cal: 33 mg/dL (ref 5–40)

## 2017-01-17 LAB — CBC WITH DIFFERENTIAL/PLATELET
BASOS ABS: 0 10*3/uL (ref 0.0–0.2)
Basos: 0 %
EOS (ABSOLUTE): 0.1 10*3/uL (ref 0.0–0.4)
Eos: 1 %
HEMOGLOBIN: 12.7 g/dL (ref 11.1–15.9)
Hematocrit: 39.4 % (ref 34.0–46.6)
IMMATURE GRANS (ABS): 0 10*3/uL (ref 0.0–0.1)
Immature Granulocytes: 0 %
LYMPHS: 62 %
Lymphocytes Absolute: 6.3 10*3/uL — ABNORMAL HIGH (ref 0.7–3.1)
MCH: 28.2 pg (ref 26.6–33.0)
MCHC: 32.2 g/dL (ref 31.5–35.7)
MCV: 88 fL (ref 79–97)
MONOCYTES: 3 %
Monocytes Absolute: 0.3 10*3/uL (ref 0.1–0.9)
NEUTROS ABS: 3.6 10*3/uL (ref 1.4–7.0)
Neutrophils: 34 %
PLATELETS: 188 10*3/uL (ref 150–379)
RBC: 4.5 x10E6/uL (ref 3.77–5.28)
RDW: 14.5 % (ref 12.3–15.4)
WBC: 10.4 10*3/uL (ref 3.4–10.8)

## 2017-01-17 LAB — COMPREHENSIVE METABOLIC PANEL
ALBUMIN: 4.3 g/dL (ref 3.6–4.8)
ALK PHOS: 74 IU/L (ref 39–117)
ALT: 14 IU/L (ref 0–32)
AST: 19 IU/L (ref 0–40)
Albumin/Globulin Ratio: 1.9 (ref 1.2–2.2)
BILIRUBIN TOTAL: 0.4 mg/dL (ref 0.0–1.2)
BUN/Creatinine Ratio: 12 (ref 12–28)
BUN: 9 mg/dL (ref 8–27)
CHLORIDE: 102 mmol/L (ref 96–106)
CO2: 25 mmol/L (ref 20–29)
CREATININE: 0.77 mg/dL (ref 0.57–1.00)
Calcium: 9.3 mg/dL (ref 8.7–10.3)
GFR calc Af Amer: 94 mL/min/{1.73_m2} (ref >59)
GFR calc non Af Amer: 81 mL/min/{1.73_m2} (ref >59)
GLUCOSE: 107 mg/dL — AB (ref 65–99)
Globulin, Total: 2.3 g/dL (ref 1.5–4.5)
Potassium: 4.8 mmol/L (ref 3.5–5.2)
Sodium: 140 mmol/L (ref 134–144)
TOTAL PROTEIN: 6.6 g/dL (ref 6.0–8.5)

## 2017-01-17 LAB — HEMOGLOBIN A1C
Est. average glucose Bld gHb Est-mCnc: 128 mg/dL
HEMOGLOBIN A1C: 6.1 % — AB (ref 4.8–5.6)

## 2017-01-17 LAB — TSH: TSH: 2.9 u[IU]/mL (ref 0.450–4.500)

## 2017-01-17 NOTE — Telephone Encounter (Signed)
LMTCB 01/17/2017  Thanks,   -Laura  

## 2017-01-17 NOTE — Telephone Encounter (Signed)
Pt advised.   Thanks,   -Chester Romero  

## 2017-01-17 NOTE — Telephone Encounter (Signed)
-----   Message from Jodell CiproDennis E Worthington Hillshrismon, [place] sent at 01/17/2017  8:20 AM EDT ----- All labs looking good with Hgb A1C the lowest it has been (down to prediabetic range now). Triglycerides 165 (goal <150) but cholesterol in good shape. Recheck progress in 6 months.

## 2017-04-13 ENCOUNTER — Other Ambulatory Visit: Payer: Self-pay | Admitting: Family Medicine

## 2017-04-13 DIAGNOSIS — E785 Hyperlipidemia, unspecified: Secondary | ICD-10-CM

## 2017-06-23 ENCOUNTER — Other Ambulatory Visit: Payer: Self-pay | Admitting: Family Medicine

## 2017-06-23 DIAGNOSIS — I10 Essential (primary) hypertension: Secondary | ICD-10-CM

## 2017-06-23 NOTE — Telephone Encounter (Signed)
Pharmacy requesting refills. Thanks!  

## 2017-06-24 NOTE — Telephone Encounter (Signed)
Schedule follow up appointment. Stopped Lisinopril in August 2018 due to a persistent cough. Need to recheck BP and cough.

## 2017-08-29 ENCOUNTER — Telehealth: Payer: Self-pay | Admitting: Family Medicine

## 2017-08-29 NOTE — Telephone Encounter (Signed)
Patient has been taking Mucinex DM, claritin and Robitussin for cough, as this was what Tammy Stevenson told her to do the last time shad a cough.  However, this is not helping her.   Please advise

## 2017-08-29 NOTE — Telephone Encounter (Signed)
Patient's daughter [name] advised. She states patient has tried all the recommendations listed below. Cira Servantdvised [name] patient would need a OV to be evaluated then. Daughter will call to see if patient can be seen tomorrow if not she will schedule for Monday.

## 2017-08-29 NOTE — Telephone Encounter (Signed)
Gargle with warm saltwater and take Mucinex-DM twice a day with Claritin 10 mg each morning. Add one teaspoon of Maalox (liquid antacid) to one teaspoon of liquid Benadryl to take at bedtime. If no better by Monday or having fever, should come in to evaluate for possible pneumonia.

## 2017-08-30 ENCOUNTER — Ambulatory Visit: Payer: BLUE CROSS/BLUE SHIELD | Admitting: Family Medicine

## 2017-08-30 ENCOUNTER — Encounter: Payer: Self-pay | Admitting: Family Medicine

## 2017-08-30 VITALS — BP 144/78 | HR 84 | Temp 98.1°F | Resp 16 | Wt 185.0 lb

## 2017-08-30 DIAGNOSIS — J301 Allergic rhinitis due to pollen: Secondary | ICD-10-CM

## 2017-08-30 MED ORDER — FLUTICASONE PROPIONATE 50 MCG/ACT NA SUSP: 2.0000 | Freq: Every day | NASAL | 6 refills | Status: DC

## 2017-08-30 NOTE — Patient Instructions (Signed)

## 2017-08-30 NOTE — Progress Notes (Signed)
Patient: Tammy Stevenson Female    DOB: 1950/12/29   67 y.o.   MRN: [id number] Visit Date: 08/30/2017  Today's Provider: Shirlee Latch, MD   Chief Complaint  Patient presents with  . URI    Started about a week ago   Subjective:    URI   This is a new problem. The problem has been unchanged. Associated symptoms include coughing and rhinorrhea. Pertinent negatives include no abdominal pain, chest pain, congestion, diarrhea, dysuria, ear pain, headaches, joint pain, joint swelling, nausea, neck pain, plugged ear sensation, rash, sinus pain, sneezing, sore throat, swollen glands, vomiting or wheezing.       Allergies  Allergen Reactions  . Azithromycin Hives and Rash     Current Outpatient Medications:  .  albuterol (PROAIR HFA) 108 (90 Base) MCG/ACT inhaler, Inhale into the lungs., Disp: , Rfl:  .  glipiZIDE (GLUCOTROL XL) 5 MG 24 hr tablet, TAKE 1 TABLET DAILY, Disp: 90 tablet, Rfl: 4 .  glucose blood (FREESTYLE LITE) test strip, Test blood sugar once a day before breakfast., Disp: 100 each, Rfl: 3 .  metFORMIN (GLUCOPHAGE) 500 MG tablet, TAKE 1 TABLET TWICE A DAY, Disp: 180 tablet, Rfl: 4 .  WELCHOL 625 MG tablet, TAKE 3 TABLETS TWICE A DAY, Disp: 540 tablet, Rfl: 2 .  Fluticasone-Salmeterol (ADVAIR DISKUS) 250-50 MCG/DOSE AEPB, Inhale into the lungs., Disp: , Rfl:   Review of Systems  Constitutional: Positive for fatigue. Negative for activity change, appetite change, chills, diaphoresis, fever and unexpected weight change.  HENT: Positive for rhinorrhea. Negative for congestion, ear pain, nosebleeds, sinus pressure, sinus pain, sneezing and sore throat.   Respiratory: Positive for cough. Negative for apnea, choking, chest tightness, shortness of breath, wheezing and stridor.   Cardiovascular: Negative for chest pain.  Gastrointestinal: Negative.  Negative for abdominal pain, diarrhea, nausea and vomiting.  Genitourinary: Negative for dysuria.  Musculoskeletal:  Negative for joint pain and neck pain.  Skin: Negative for rash.  Neurological: Negative for dizziness, light-headedness and headaches.    Social History   Tobacco Use  . Smoking status: Never Smoker  . Smokeless tobacco: Current User  Substance Use Topics  . Alcohol use: No    Alcohol/week: 0.0 oz   Objective:   BP (!) 144/78 (BP Location: Right Arm, Patient Position: Sitting, Cuff Size: Normal)   Pulse 84   Temp 98.1 F (36.7 C) (Oral)   Resp 16   Wt 185 lb (83.9 kg)   SpO2 95%   BMI 31.76 kg/m  Vitals:   08/30/17 0909  BP: (!) 144/78  Pulse: 84  Resp: 16  Temp: 98.1 F (36.7 C)  TempSrc: Oral  SpO2: 95%  Weight: 185 lb (83.9 kg)     Physical Exam  Constitutional: She is oriented to person, place, and time. She appears well-developed and well-nourished. No distress.  HENT:  Head: Normocephalic and atraumatic.  Right Ear: Tympanic membrane, external ear and ear canal normal.  Left Ear: Tympanic membrane, external ear and ear canal normal.  Nose: Mucosal edema present. Right sinus exhibits no maxillary sinus tenderness and no frontal sinus tenderness. Left sinus exhibits no maxillary sinus tenderness and no frontal sinus tenderness.  Mouth/Throat: Uvula is midline, oropharynx is clear and moist and mucous membranes are normal. No oropharyngeal exudate or posterior oropharyngeal erythema.  Eyes: Pupils are equal, round, and reactive to light. Conjunctivae and EOM are normal. Right eye exhibits no discharge. Left eye exhibits no discharge. No scleral icterus.  Neck: Neck supple. No thyromegaly present.  Cardiovascular: Normal rate, regular rhythm, normal heart sounds and intact distal pulses.  No murmur heard. Pulmonary/Chest: Effort normal and breath sounds normal. No respiratory distress. She has no wheezes. She has no rales.  Musculoskeletal: She exhibits no edema.  Lymphadenopathy:    She has no cervical adenopathy.  Neurological: She is alert and oriented to  person, place, and time. Coordination normal.  Skin: Skin is warm and dry. Capillary refill takes less than 2 seconds. No rash noted.  Psychiatric: She has a normal mood and affect. Her behavior is normal.  Vitals reviewed.    Assessment & Plan:   1. Seasonal allergic rhinitis due to pollen - symptoms and exam c/w allergic rhinitis - no evidence of strep pharyngitis, CAP, AOM, bacterial sinusitis, or other bacterial infection - discussed symptomatic management, natural course, and return precautions - continue antihistamine, prn Mucines, and add flonase daily    Meds ordered this encounter  Medications  . fluticasone (FLONASE) 50 MCG/ACT nasal spray    Sig: Place 2 sprays into both nostrils daily.    Dispense:  16 g    Refill:  6     Return if symptoms worsen or fail to improve.   The entirety of the information documented in the History of Present Illness, Review of Systems and Physical Exam were personally obtained by me. Portions of this information were initially documented by Kavin LeechLaura Walsh, CMA and reviewed by me for thoroughness and accuracy.    Erasmo DownerBacigalupo, Laine Giovanetti M, MD, MPH [facility] 08/30/2017 10:00 AM  .

## 2017-12-15 ENCOUNTER — Other Ambulatory Visit: Payer: Self-pay | Admitting: Family Medicine

## 2018-01-11 ENCOUNTER — Other Ambulatory Visit: Payer: Self-pay | Admitting: Family Medicine

## 2018-01-11 DIAGNOSIS — E785 Hyperlipidemia, unspecified: Secondary | ICD-10-CM

## 2018-03-15 ENCOUNTER — Other Ambulatory Visit: Payer: Self-pay | Admitting: Family Medicine

## 2018-03-16 NOTE — Telephone Encounter (Signed)
See refill request.

## 2018-03-20 ENCOUNTER — Other Ambulatory Visit: Payer: Self-pay

## 2018-03-20 ENCOUNTER — Encounter: Payer: Self-pay | Admitting: Family Medicine

## 2018-03-20 ENCOUNTER — Ambulatory Visit: Payer: BLUE CROSS/BLUE SHIELD | Admitting: Family Medicine

## 2018-03-20 VITALS — BP 178/108 | HR 74 | Temp 98.2°F | Ht 64.0 in | Wt 186.4 lb

## 2018-03-20 DIAGNOSIS — E119 Type 2 diabetes mellitus without complications: Secondary | ICD-10-CM | POA: Diagnosis not present

## 2018-03-20 DIAGNOSIS — E782 Mixed hyperlipidemia: Secondary | ICD-10-CM | POA: Diagnosis not present

## 2018-03-20 DIAGNOSIS — I10 Essential (primary) hypertension: Secondary | ICD-10-CM | POA: Diagnosis not present

## 2018-03-20 DIAGNOSIS — H9193 Unspecified hearing loss, bilateral: Secondary | ICD-10-CM | POA: Diagnosis not present

## 2018-03-20 DIAGNOSIS — E1169 Type 2 diabetes mellitus with other specified complication: Secondary | ICD-10-CM

## 2018-03-20 DIAGNOSIS — Z23 Encounter for immunization: Secondary | ICD-10-CM | POA: Diagnosis not present

## 2018-03-20 DIAGNOSIS — B351 Tinea unguium: Secondary | ICD-10-CM

## 2018-03-20 LAB — POCT UA - MICROALBUMIN: Microalbumin Ur, POC: 50 mg/L

## 2018-03-20 LAB — POCT GLYCOSYLATED HEMOGLOBIN (HGB A1C): Hemoglobin A1C: 6.6 % — AB (ref 4.0–5.6)

## 2018-03-20 MED ORDER — LOSARTAN POTASSIUM 50 MG PO TABS: 50.0000 mg | ORAL_TABLET | Freq: Every day | ORAL | 3 refills | Status: DC

## 2018-03-20 NOTE — Progress Notes (Signed)
Patient: Tammy Stevenson Female    DOB: [birth year]   67 y.o.   MRN: [id number] Visit Date: 03/20/2018  Today's Provider: Dortha Kern, PA   Chief Complaint  Patient presents with  . Diabetes    follow up  . Medication Refill   Subjective:    HPI  Pt reports she is here for a diabetic fup and medication refills.  Pt also reports at times she has SOB that may happen every several weeks.  Pt also reports that her sugar drops at times. Stopped the Glipizide and one of the Metformin 500 mg tablets when blood sugar dropped to 48 five days ago. No polyuria, polydipsia, vision disturbance or peripheral neuropathy.      Past Medical History:  Diagnosis Date  . Diabetes [facility])    Patient Active Problem List   Diagnosis Date Noted  . Essential hypertension 10/16/2016  . Arthritis 07/10/2015  . Bilateral hearing loss 07/10/2015  . HLD (hyperlipidemia) 07/10/2015  . Cervical paraspinal muscle spasm 07/10/2015  . Type 2 diabetes mellitus (HCC) 07/10/2015   Past Surgical History:  Procedure Laterality Date  . ANKLE SURGERY Right    Family History  Problem Relation Age of Onset  . Heart disease Mother   . Stroke Mother   . Hypertension Father   . Lupus Sister    Allergies  Allergen Reactions  . Azithromycin Hives and Rash    Current Outpatient Medications:  .  albuterol (PROAIR HFA) 108 (90 Base) MCG/ACT inhaler, Inhale into the lungs. , Disp: , Rfl:  .  fluticasone (FLONASE) 50 MCG/ACT nasal spray, Place 2 sprays into both nostrils daily., Disp: 16 g, Rfl: 6 .  glipiZIDE (GLUCOTROL XL) 5 MG 24 hr tablet, TAKE 1 TABLET DAILY, Disp: 90 tablet, Rfl: 4 .  glucose blood (FREESTYLE LITE) test strip, Test blood sugar once a day before breakfast., Disp: 100 each, Rfl: 3 .  metFORMIN (GLUCOPHAGE) 500 MG tablet, TAKE 1 TABLET TWICE A DAY, Disp: 180 tablet, Rfl: 4 .  WELCHOL 625 MG tablet, TAKE 3 TABLETS TWICE A DAY, Disp: 540 tablet, Rfl: 2 .  Fluticasone-Salmeterol (ADVAIR  DISKUS) 250-50 MCG/DOSE AEPB, Inhale into the lungs., Disp: , Rfl:   Review of Systems  Constitutional: Negative.   HENT: Negative.   Eyes: Negative.   Respiratory: Negative.   Cardiovascular: Negative.   Gastrointestinal: Negative.   Endocrine: Negative.   Genitourinary: Negative.   Musculoskeletal: Negative.   Skin: Negative.   Allergic/Immunologic: Negative.   Neurological: Negative.   Hematological: Negative.   Psychiatric/Behavioral: Negative.    Social History   Tobacco Use  . Smoking status: Never Smoker  . Smokeless tobacco: Current User  Substance Use Topics  . Alcohol use: No    Alcohol/week: 0.0 standard drinks   Objective:   BP (!) 178/108 (BP Location: Left Arm, Patient Position: Sitting, Cuff Size: Normal)   Pulse 74   Temp 98.2 F (36.8 C) (Oral)   Ht 5\' 4"  (1.626 m)   Wt 186 lb 6.4 oz (84.6 kg)   SpO2 98%   BMI 32.00 kg/m  Vitals:   03/20/18 0938  BP: (!) 178/108  Pulse: 74  Temp: 98.2 F (36.8 C)  TempSrc: Oral  SpO2: 98%  Weight: 186 lb 6.4 oz (84.6 kg)  Height: 5\' 4"  (1.626 m)   Physical Exam  Constitutional: She is oriented to person, place, and time. She appears well-developed and well-nourished. No distress.  HENT:  Head: Normocephalic and atraumatic.  Right Ear: Hearing and external ear normal.  Left Ear: Hearing and external ear normal.  Nose: Nose normal.  Significant hearing loss - uses hearing aid in the left ear only.  Eyes: Conjunctivae and lids are normal. Right eye exhibits no discharge. Left eye exhibits no discharge. No scleral icterus.  Cardiovascular: Normal rate, regular rhythm and normal heart sounds.  Pulmonary/Chest: Effort normal and breath sounds normal. No respiratory distress.  Musculoskeletal: Normal range of motion.  Thick and curling toe nails. No pain or erythema.  Neurological: She is alert and oriented to person, place, and time. No sensory deficit.  Skin: Skin is intact. No lesion and no rash noted.    Psychiatric: She has a normal mood and affect. Her speech is normal and behavior is normal. Thought content normal.   Diabetic Foot Form - Detailed   Diabetic Foot Exam - detailed Diabetic Foot exam was performed with the following findings:  Yes 03/20/2018 10:18 AM  Visual Foot Exam completed.:  Yes  Can the patient see the bottom of their feet?:  Yes Are the shoes appropriate in style and fit?:  Yes Is there swelling or and abnormal foot shape?:  No Is there a claw toe deformity?:  No Is there elevated skin temparature?:  No Is there foot or ankle muscle weakness?:  No Normal Range of Motion:  Yes Pulse Foot Exam completed.:  Yes  Right posterior Tibialias:  Present Left posterior Tibialias:  Present  Right Dorsalis Pedis:  Present Left Dorsalis Pedis:  Present  Sensory Foot Exam Completed.:  Yes Semmes-Weinstein Monofilament Test R Site 1-Great Toe:  Pos L Site 1-Great Toe:  Pos          Assessment & Plan:     1. Type 2 diabetes mellitus without complication, without long-term current use of insulin (HCC) Hgb A1C 6.6% today. Has stopped the Glipizide and decreased Metformin to 500 mg at bedtime only since blood sugar dropped to 48 five days ago. Microalbumin still up to 50 mg/L (could not take the Lisinopril due to cough side effect). Will start ARB. Encouraged to get ophthalmology exam. Normal foot exam today. Recheck labs and follow up pending reports. - POCT glycosylated hemoglobin (Hb A1C) - POCT UA - Microalbumin - losartan (COZAAR) 50 MG tablet; Take 1 tablet (50 mg total) by mouth daily.  Dispense: 30 tablet; Refill: 3 - CBC with Differential/Platelet - Comprehensive metabolic panel - Lipid panel  2. Essential hypertension Uncontrolled BP. Stopped the ACE due to some coughing. Will give trial of ARB and recheck CBC, CMP with Lipid Panel to assess cardiovascular risk. Restrict sodium intake and recheck pending lab reports. - losartan (COZAAR) 50 MG tablet; Take 1 tablet  (50 mg total) by mouth daily.  Dispense: 30 tablet; Refill: 3 - CBC with Differential/Platelet - Comprehensive metabolic panel - Lipid panel  3. Bilateral hearing loss, unspecified hearing loss type Unchanged. Continues to use the hearing aid in only the left ear.  4. Mixed hyperlipidemia Presently off the Welchol. 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 Recheck labs and follow up pending reports. - Comprehensive metabolic panel - Lipid panel  5. Need for Streptococcus pneumoniae vaccination - Pneumococcal polysaccharide vaccine 23-valent greater than or equal to 2yo subcutaneous/IM  6. Onychomycosis of multiple toenails with type 2 diabetes mellitus (HCC) Multiple toenails very thick and curling. Schedule podiatry referral. - Ambulatory referral to Podiatry       Dortha Kern, PA  [facility]

## 2018-03-21 LAB — CBC WITH DIFFERENTIAL/PLATELET
Basophils Absolute: 0.1 10*3/uL (ref 0.0–0.2)
Basos: 1 %
EOS (ABSOLUTE): 0.1 10*3/uL (ref 0.0–0.4)
Eos: 1 %
Hematocrit: 38.6 % (ref 34.0–46.6)
Hemoglobin: 12.8 g/dL (ref 11.1–15.9)
Immature Grans (Abs): 0 10*3/uL (ref 0.0–0.1)
Immature Granulocytes: 0 %
Lymphocytes Absolute: 5.3 10*3/uL — ABNORMAL HIGH (ref 0.7–3.1)
Lymphs: 58 %
MCH: 27.2 pg (ref 26.6–33.0)
MCHC: 33.2 g/dL (ref 31.5–35.7)
MCV: 82 fL (ref 79–97)
Monocytes Absolute: 0.3 10*3/uL (ref 0.1–0.9)
Monocytes: 3 %
Neutrophils Absolute: 3.4 10*3/uL (ref 1.4–7.0)
Neutrophils: 37 %
Platelets: 184 10*3/uL (ref 150–450)
RBC: 4.71 x10E6/uL (ref 3.77–5.28)
RDW: 13.8 % (ref 12.3–15.4)
WBC: 9.2 10*3/uL (ref 3.4–10.8)

## 2018-03-21 LAB — LIPID PANEL
Chol/HDL Ratio: 3.4 ratio (ref 0.0–4.4)
Cholesterol, Total: 193 mg/dL (ref 100–199)
HDL: 56 mg/dL (ref >39)
LDL Calculated: 103 mg/dL — ABNORMAL HIGH (ref 0–99)
Triglycerides: 172 mg/dL — ABNORMAL HIGH (ref 0–149)
VLDL Cholesterol Cal: 34 mg/dL (ref 5–40)

## 2018-03-21 LAB — COMPREHENSIVE METABOLIC PANEL
A/G RATIO: 2 (ref 1.2–2.2)
ALT: 14 IU/L (ref 0–32)
AST: 20 IU/L (ref 0–40)
Albumin: 4.3 g/dL (ref 3.6–4.8)
Alkaline Phosphatase: 72 IU/L (ref 39–117)
BILIRUBIN TOTAL: 0.6 mg/dL (ref 0.0–1.2)
BUN / CREAT RATIO: 7 — AB (ref 12–28)
BUN: 6 mg/dL — ABNORMAL LOW (ref 8–27)
CALCIUM: 9.5 mg/dL (ref 8.7–10.3)
CO2: 25 mmol/L (ref 20–29)
Chloride: 101 mmol/L (ref 96–106)
Creatinine, Ser: 0.83 mg/dL (ref 0.57–1.00)
GFR, EST AFRICAN AMERICAN: 85 mL/min/{1.73_m2} (ref >59)
GFR, EST NON AFRICAN AMERICAN: 74 mL/min/{1.73_m2} (ref >59)
GLOBULIN, TOTAL: 2.2 g/dL (ref 1.5–4.5)
Glucose: 123 mg/dL — ABNORMAL HIGH (ref 65–99)
POTASSIUM: 4.4 mmol/L (ref 3.5–5.2)
SODIUM: 139 mmol/L (ref 134–144)
TOTAL PROTEIN: 6.5 g/dL (ref 6.0–8.5)

## 2018-03-23 ENCOUNTER — Telehealth: Payer: Self-pay | Admitting: Family Medicine

## 2018-03-23 NOTE — Telephone Encounter (Signed)
Returning missed call.  Andria Meuse - daughter calling back for pt's labs.  Call her at: [phone].  Thanks, [employer]

## 2018-03-26 ENCOUNTER — Other Ambulatory Visit: Payer: Self-pay | Admitting: Family Medicine

## 2018-03-26 DIAGNOSIS — E785 Hyperlipidemia, unspecified: Secondary | ICD-10-CM

## 2018-03-26 MED ORDER — COLESEVELAM HCL 625 MG PO TABS: 1875.0000 mg | ORAL_TABLET | Freq: Two times a day (BID) | ORAL | 3 refills | Status: DC

## 2018-03-26 NOTE — Progress Notes (Signed)
Sometimes triglycerides with fluctuate with the changes in blood sugar. Changing to the Benefiber with the Co-Q 10 100 mg qd usually helps. Continue to follow diabetic diet. Hopefully this will be better when rechecked in 3-4 months and not need an additional medication like one of the statins.

## 2018-03-26 NOTE — Progress Notes (Signed)
Done. If going to use the Welchol, you don't have to use the Benefiber but should take the Co-Q 10 100 mg qd (not a prescription).

## 2018-04-03 ENCOUNTER — Ambulatory Visit
Admission: RE | Admit: 2018-04-03 | Discharge: 2018-04-03 | Disposition: A | Discharge status: Home / Self Care | Payer: BLUE CROSS/BLUE SHIELD | Source: Ambulatory Visit | Attending: Family Medicine | Admitting: Family Medicine

## 2018-04-03 ENCOUNTER — Other Ambulatory Visit: Payer: Self-pay

## 2018-04-03 ENCOUNTER — Encounter: Payer: Self-pay | Admitting: Family Medicine

## 2018-04-03 ENCOUNTER — Ambulatory Visit: Payer: BLUE CROSS/BLUE SHIELD | Admitting: Family Medicine

## 2018-04-03 VITALS — BP 190/86 | HR 71 | Temp 98.0°F | Ht 64.0 in | Wt 186.2 lb

## 2018-04-03 DIAGNOSIS — M25562 Pain in left knee: Secondary | ICD-10-CM | POA: Insufficient documentation

## 2018-04-03 DIAGNOSIS — I10 Essential (primary) hypertension: Secondary | ICD-10-CM

## 2018-04-03 DIAGNOSIS — M25462 Effusion, left knee: Secondary | ICD-10-CM | POA: Diagnosis not present

## 2018-04-03 DIAGNOSIS — I739 Peripheral vascular disease, unspecified: Secondary | ICD-10-CM | POA: Insufficient documentation

## 2018-04-03 DIAGNOSIS — M1712 Unilateral primary osteoarthritis, left knee: Secondary | ICD-10-CM | POA: Diagnosis not present

## 2018-04-03 MED ORDER — DICLOFENAC SODIUM 75 MG PO TBEC: 75.0000 mg | DELAYED_RELEASE_TABLET | Freq: Two times a day (BID) | ORAL | 0 refills | Status: DC

## 2018-04-03 NOTE — Progress Notes (Signed)
Patient: Tammy Stevenson Female    DOB: 1950/12/09   66 y.o.   MRN: [id number] Visit Date: 04/03/2018  Today's Provider: Dortha Kern, PA   Chief Complaint  Patient presents with  . Follow-up    blood pressure  . Knee Pain    left knee x 1 week 03/27/18   Subjective:    HPI  Pt reports she is here for a recheck of her blood pressure.  Pt also reports she has pain in left knee for 1 week 03/27/18 but doesn't recall injuring it.     Past Medical History:  Diagnosis Date  . Diabetes [facility])    Past Surgical History:  Procedure Laterality Date  . ANKLE SURGERY Right    Family History  Problem Relation Age of Onset  . Heart disease Mother   . Stroke Mother   . Hypertension Father   . Lupus Sister    Allergies  Allergen Reactions  . Azithromycin Hives and Rash    Current Outpatient Medications:  .  albuterol (PROAIR HFA) 108 (90 Base) MCG/ACT inhaler, Inhale into the lungs. , Disp: , Rfl:  .  colesevelam (WELCHOL) 625 MG tablet, Take 3 tablets (1,875 mg total) by mouth 2 (two) times daily., Disp: 540 tablet, Rfl: 3 .  fluticasone (FLONASE) 50 MCG/ACT nasal spray, Place 2 sprays into both nostrils daily., Disp: 16 g, Rfl: 6 .  Fluticasone-Salmeterol (ADVAIR DISKUS) 250-50 MCG/DOSE AEPB, Inhale into the lungs., Disp: , Rfl:  .  glipiZIDE (GLUCOTROL XL) 5 MG 24 hr tablet, TAKE 1 TABLET DAILY, Disp: 90 tablet, Rfl: 4 .  glucose blood (FREESTYLE LITE) test strip, Test blood sugar once a day before breakfast., Disp: 100 each, Rfl: 3 .  losartan (COZAAR) 50 MG tablet, Take 1 tablet (50 mg total) by mouth daily., Disp: 30 tablet, Rfl: 3 .  metFORMIN (GLUCOPHAGE) 500 MG tablet, TAKE 1 TABLET TWICE A DAY, Disp: 180 tablet, Rfl: 4  Review of Systems  Constitutional: Negative.   HENT: Negative.   Eyes: Negative.   Respiratory: Negative.   Cardiovascular: Negative.   Gastrointestinal: Negative.   Endocrine: Negative.   Genitourinary: Negative.   Musculoskeletal: Positive  for arthralgias (pain in left knee x 1 week 03/27/18).  Skin: Negative.   Allergic/Immunologic: Negative.   Neurological: Negative.   Hematological: Negative.   Psychiatric/Behavioral: Negative.    Social History   Tobacco Use  . Smoking status: Never Smoker  . Smokeless tobacco: Current User  Substance Use Topics  . Alcohol use: No    Alcohol/week: 0.0 standard drinks   Objective:   BP (!) 190/86 (BP Location: Left Arm, Patient Position: Sitting, Cuff Size: Normal)   Pulse 71   Temp 98 F (36.7 C) (Oral)   Ht 5\' 4"  (1.626 m)   Wt 186 lb 3.2 oz (84.5 kg)   SpO2 94%   BMI 31.96 kg/m   BP Readings from Last 3 Encounters:  04/03/18 (!) 190/86  03/20/18 (!) 178/108  08/30/17 (!) 144/78   Wt Readings from Last 3 Encounters:  04/03/18 186 lb 3.2 oz (84.5 kg)  03/20/18 186 lb 6.4 oz (84.6 kg)  08/30/17 185 lb (83.9 kg)   Vitals:   04/03/18 0833  BP: (!) 190/86  Pulse: 71  Temp: 98 F (36.7 C)  TempSrc: Oral  SpO2: 94%  Weight: 186 lb 3.2 oz (84.5 kg)  Height: 5\' 4"  (1.626 m)   Physical Exam  Constitutional: She is oriented to person, place,  and time. She appears well-developed and well-nourished. No distress.  HENT:  Head: Normocephalic and atraumatic.  Right Ear: Hearing normal.  Left Ear: Hearing normal.  Nose: Nose normal.  Eyes: Conjunctivae and lids are normal. Right eye exhibits no discharge. Left eye exhibits no discharge. No scleral icterus.  Cardiovascular: Regular rhythm.  Pulmonary/Chest: Effort normal and breath sounds normal. No respiratory distress.  Musculoskeletal: Normal range of motion. She exhibits edema and tenderness.  Left knee pain sharp when stepping up on a step. Effusion noted on exam. Has FROM. No ligament laxity or clicking.  Neurological: She is alert and oriented to person, place, and time.  Skin: Skin is intact. No lesion and no rash noted.  Psychiatric: She has a normal mood and affect. Her speech is normal and behavior is normal.  Thought content normal.      Assessment & Plan:     1. Acute pain of left knee Onset over the past week. First noted after going out to close the chicken coop door on the evening of 03-27-18. No stumbling, fall or twisting occurred. Some swelling/effusion noted and only pain occurs when ascending stairs if she starts with the left foot first. Suspect arthritis versus cartilage injury. Will treat with Diclofenac-EC 75 mg BID and elastic knee supporter. Get x-ray evaluation. May need referral to orthopedist if no better in 10-14 days. - DG Knee Complete 4 Views Left - diclofenac (VOLTAREN) 75 MG EC tablet; Take 1 tablet (75 mg total) by mouth 2 (two) times daily.  Dispense: 30 tablet; Refill: 0  2. Essential hypertension Improvement in diastolic pressure with use of the Cozaar 50 mg qd. Recent week of left knee pain may be limiting response in systolic pressure. Continue same dosage and recheck BP as knee treatment started.        Dortha Kernennis , PA  [facility]

## 2018-04-27 ENCOUNTER — Other Ambulatory Visit: Payer: Self-pay | Admitting: Family Medicine

## 2018-04-27 DIAGNOSIS — M25562 Pain in left knee: Secondary | ICD-10-CM

## 2018-05-25 ENCOUNTER — Other Ambulatory Visit: Payer: Self-pay

## 2018-05-25 DIAGNOSIS — M25562 Pain in left knee: Secondary | ICD-10-CM

## 2018-05-25 MED ORDER — DICLOFENAC SODIUM 75 MG PO TBEC: DELAYED_RELEASE_TABLET | ORAL | 0 refills | Status: DC

## 2018-05-25 NOTE — Telephone Encounter (Signed)
Patients daughter had called the office stating that patient was still having knee pain and states that Diclofenac had helped with pain. Daughter is requesting refill be sent to [facility] [address] KW

## 2018-06-12 NOTE — Progress Notes (Signed)
Patient: Tammy SparkMargie Elenbaas Female    DOB: 10/05/1950   68 y.o.   MRN: [id number] Visit Date: 06/15/2018  Today's Provider: Dortha Kernennis [place], PA   Chief Complaint  Patient presents with  . Knee Pain   Subjective:     Knee Pain   Incident onset: This is a chronic problem. There was no injury mechanism. The pain is present in the left knee. The quality of the pain is described as aching. The pain is at a severity of 10/10. The pain is severe. The pain has been constant since onset. Associated symptoms include an inability to bear weight. Pertinent negatives include no numbness or tingling. She reports no foreign bodies present. The symptoms are aggravated by weight bearing. Treatments tried: Knee brace and Diclofenac. The treatment provided no relief.   Past Medical History:  Diagnosis Date  . Diabetes [facility](HCC)    Past Surgical History:  Procedure Laterality Date  . ANKLE SURGERY Right    Family History  Problem Relation Age of Onset  . Heart disease Mother   . Stroke Mother   . Hypertension Father   . Lupus Sister    Allergies  Allergen Reactions  . Azithromycin Hives and Rash    Current Outpatient Medications:  .  albuterol (PROAIR HFA) 108 (90 Base) MCG/ACT inhaler, Inhale into the lungs. , Disp: , Rfl:  .  colesevelam (WELCHOL) 625 MG tablet, Take 3 tablets (1,875 mg total) by mouth 2 (two) times daily., Disp: 540 tablet, Rfl: 3 .  diclofenac (VOLTAREN) 75 MG EC tablet, TAKE 1 TABLET(75 MG) BY MOUTH TWICE DAILY, Disp: 30 tablet, Rfl: 0 .  fluticasone (FLONASE) 50 MCG/ACT nasal spray, Place 2 sprays into both nostrils daily., Disp: 16 g, Rfl: 6 .  Fluticasone-Salmeterol (ADVAIR DISKUS) 250-50 MCG/DOSE AEPB, Inhale into the lungs., Disp: , Rfl:  .  glipiZIDE (GLUCOTROL XL) 5 MG 24 hr tablet, TAKE 1 TABLET DAILY, Disp: 90 tablet, Rfl: 4 .  glucose blood (FREESTYLE LITE) test strip, Test blood sugar once a day before breakfast., Disp: 100 each, Rfl: 3 .  losartan (COZAAR) 50  MG tablet, Take 1 tablet (50 mg total) by mouth daily., Disp: 30 tablet, Rfl: 3 .  metFORMIN (GLUCOPHAGE) 500 MG tablet, TAKE 1 TABLET TWICE A DAY, Disp: 180 tablet, Rfl: 4  Review of Systems  Musculoskeletal: Positive for arthralgias.       Left knee pain.  Neurological: Negative for tingling and numbness.   Social History   Tobacco Use  . Smoking status: Never Smoker  . Smokeless tobacco: Current User  Substance Use Topics  . Alcohol use: No    Alcohol/week: 0.0 standard drinks     Objective:   BP (!) 170/80 (BP Location: Right Arm, Patient Position: Sitting, Cuff Size: Large)   Pulse 74   Temp (!) 97.5 F (36.4 C) (Oral)   Resp 16   Wt 201 lb 3.2 oz (91.3 kg)   SpO2 97%   BMI 34.54 kg/m  Vitals:   06/15/18 0952  BP: (!) 170/80  Pulse: 74  Resp: 16  Temp: (!) 97.5 F (36.4 C)  TempSrc: Oral  SpO2: 97%  Weight: 201 lb 3.2 oz (91.3 kg)   Physical Exam Constitutional:      General: She is not in acute distress.    Appearance: She is well-developed.  HENT:     Head: Normocephalic and atraumatic.     Right Ear: Hearing normal.     Left Ear:  Hearing normal.     Nose: Nose normal.  Eyes:     General: Lids are normal. No scleral icterus.       Right eye: No discharge.        Left eye: No discharge.     Conjunctiva/sclera: Conjunctivae normal.  Pulmonary:     Effort: Pulmonary effort is normal. No respiratory distress.  Musculoskeletal:        General: Swelling and tenderness present.     Comments: Tender left knee (worse medially) with tense effusion and decreased ROM due to pain. No erythema or heat to palpation.   Skin:    Findings: No lesion or rash.  Neurological:     Mental Status: She is alert and oriented to person, place, and time.  Psychiatric:        Speech: Speech normal.        Behavior: Behavior normal.        Thought Content: Thought content normal.       Assessment & Plan     1. Arthritis of left knee Flare of left knee pain and  swelling over the past few days. Using elastic sleeve but no NSAID at the present. Diclofenac slight help 2 weeks ago. Will give EC-Naproxen 500 mg BID and schedule orthopedic referral. May need cortisone injection or arthroscopic procedure to clean up joint. No fever or redness of the joint today. Significant effusion present. Recommend elevation and may apply moist heat. Recheck in 10-14 days or sooner if no better.  - naproxen (EC NAPROSYN) 500 MG EC tablet; Take 1 tablet (500 mg total) by mouth 2 (two) times daily with a meal.  Dispense: 60 tablet; Refill: 1 - Ambulatory referral to Orthopedic Surgery     Dortha Kern, PA  [facility]

## 2018-06-15 ENCOUNTER — Ambulatory Visit: Payer: BLUE CROSS/BLUE SHIELD | Admitting: Family Medicine

## 2018-06-15 ENCOUNTER — Encounter: Payer: Self-pay | Admitting: Family Medicine

## 2018-06-15 VITALS — BP 170/80 | HR 74 | Temp 97.5°F | Resp 16 | Wt 201.2 lb

## 2018-06-15 DIAGNOSIS — M1712 Unilateral primary osteoarthritis, left knee: Secondary | ICD-10-CM

## 2018-06-15 MED ORDER — NAPROXEN 500 MG PO TBEC: 500.0000 mg | DELAYED_RELEASE_TABLET | Freq: Two times a day (BID) | ORAL | 1 refills | Status: DC

## 2018-06-25 ENCOUNTER — Ambulatory Visit: Payer: Self-pay | Admitting: Family Medicine

## 2018-06-30 DIAGNOSIS — M25562 Pain in left knee: Secondary | ICD-10-CM | POA: Diagnosis not present

## 2018-07-27 ENCOUNTER — Other Ambulatory Visit: Payer: Self-pay | Admitting: Family Medicine

## 2018-07-27 DIAGNOSIS — E785 Hyperlipidemia, unspecified: Secondary | ICD-10-CM

## 2018-07-27 MED ORDER — COLESEVELAM HCL 625 MG PO TABS: 1875.0000 mg | ORAL_TABLET | Freq: Two times a day (BID) | ORAL | 3 refills | Status: DC

## 2018-07-27 NOTE — Telephone Encounter (Signed)
Pt's daugter called saying her mothers rx for Welchol 625 mg needs to be sent to [facility] not the local pharmacy  CB#  [phone]  [facility]

## 2018-08-13 ENCOUNTER — Other Ambulatory Visit: Payer: Self-pay | Admitting: Family Medicine

## 2018-08-13 DIAGNOSIS — I10 Essential (primary) hypertension: Secondary | ICD-10-CM

## 2018-08-13 DIAGNOSIS — E119 Type 2 diabetes mellitus without complications: Secondary | ICD-10-CM

## 2019-01-20 ENCOUNTER — Other Ambulatory Visit: Payer: Self-pay | Admitting: Family Medicine

## 2019-01-20 DIAGNOSIS — E119 Type 2 diabetes mellitus without complications: Secondary | ICD-10-CM

## 2019-01-20 DIAGNOSIS — I10 Essential (primary) hypertension: Secondary | ICD-10-CM

## 2019-05-29 IMAGING — CR DG KNEE COMPLETE 4+V*L*
1 series · 4 of 4 positions shown · non-contrast
Comparison: No recent.

CLINICAL DATA: Left anterior knee pain and swelling. No known
injury.

EXAM:
LEFT KNEE - COMPLETE 4+ VIEW

[Series 1: dg knee complete 4 views left · 0.14mm/px · 4 of 4 slices shown]
[im 1/4]
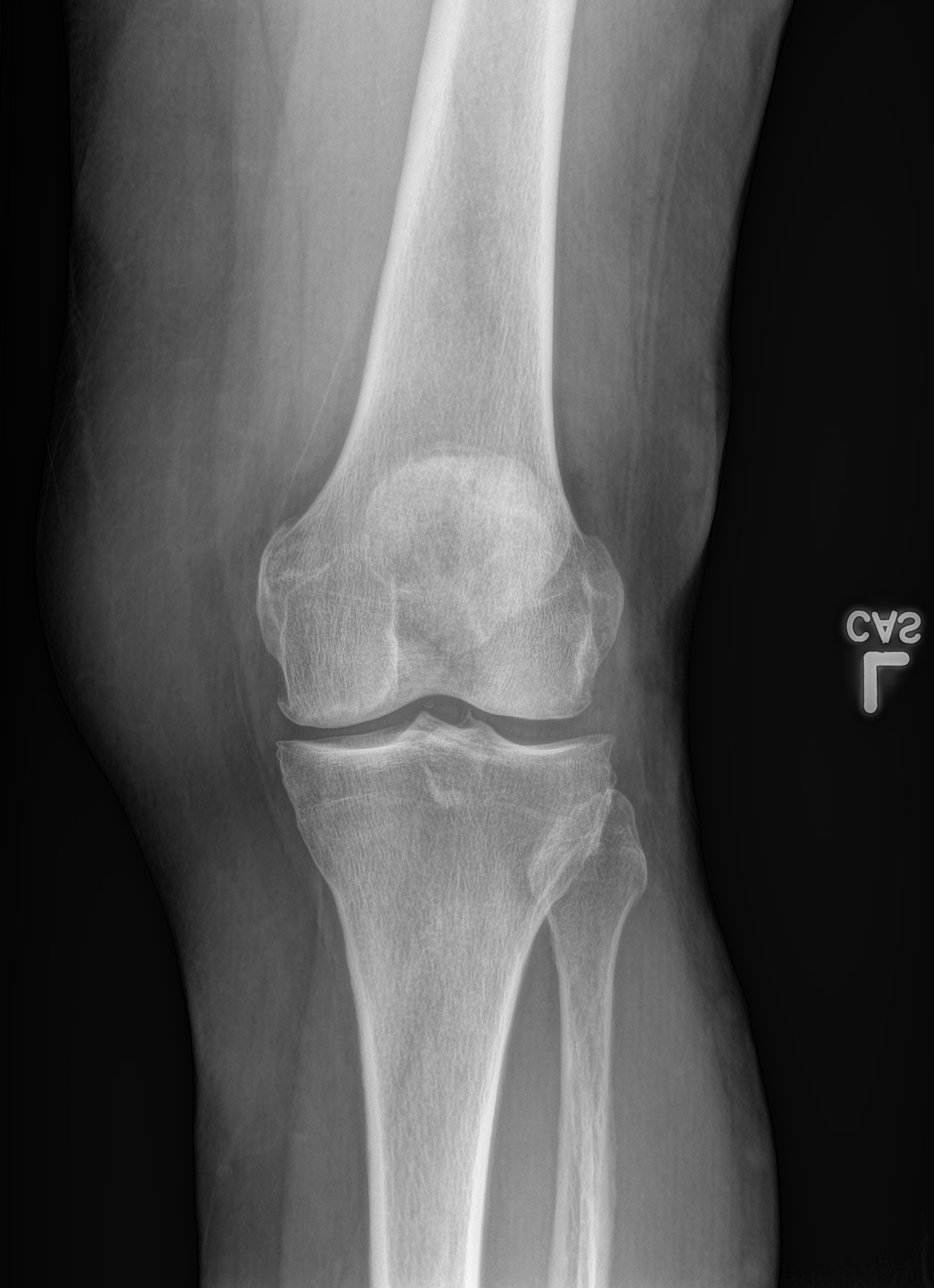
[im 2/4]
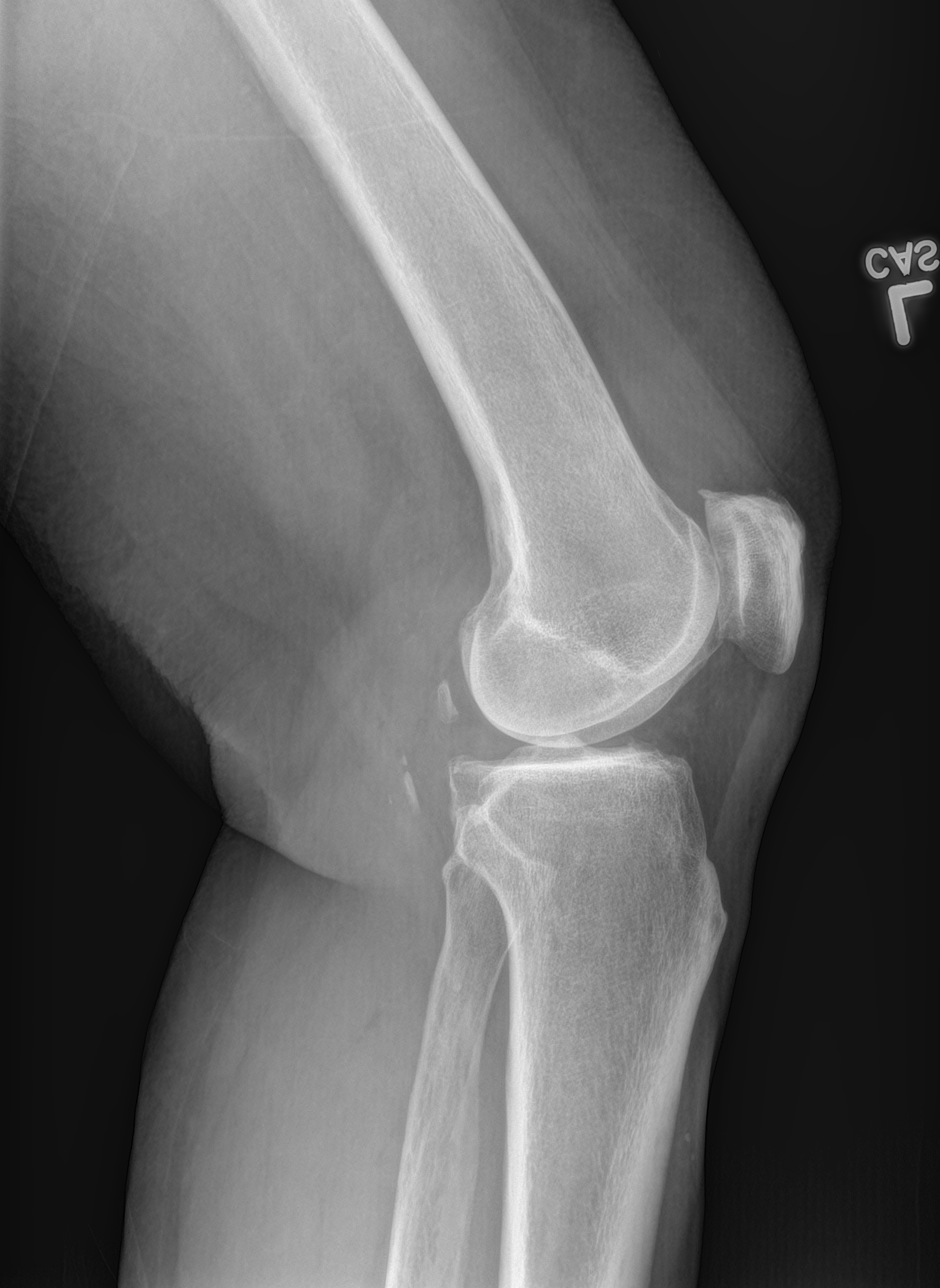
[im 3/4]
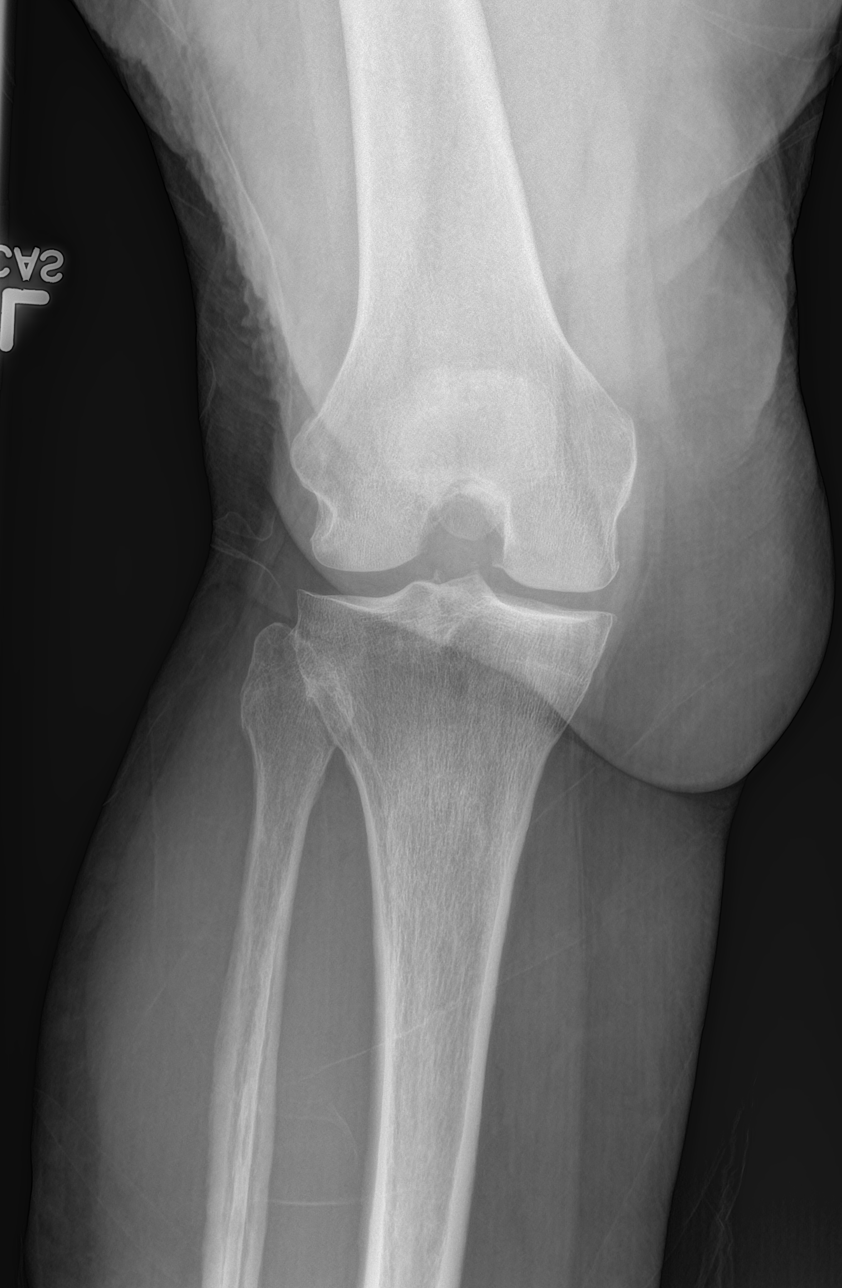
[im 4/4]
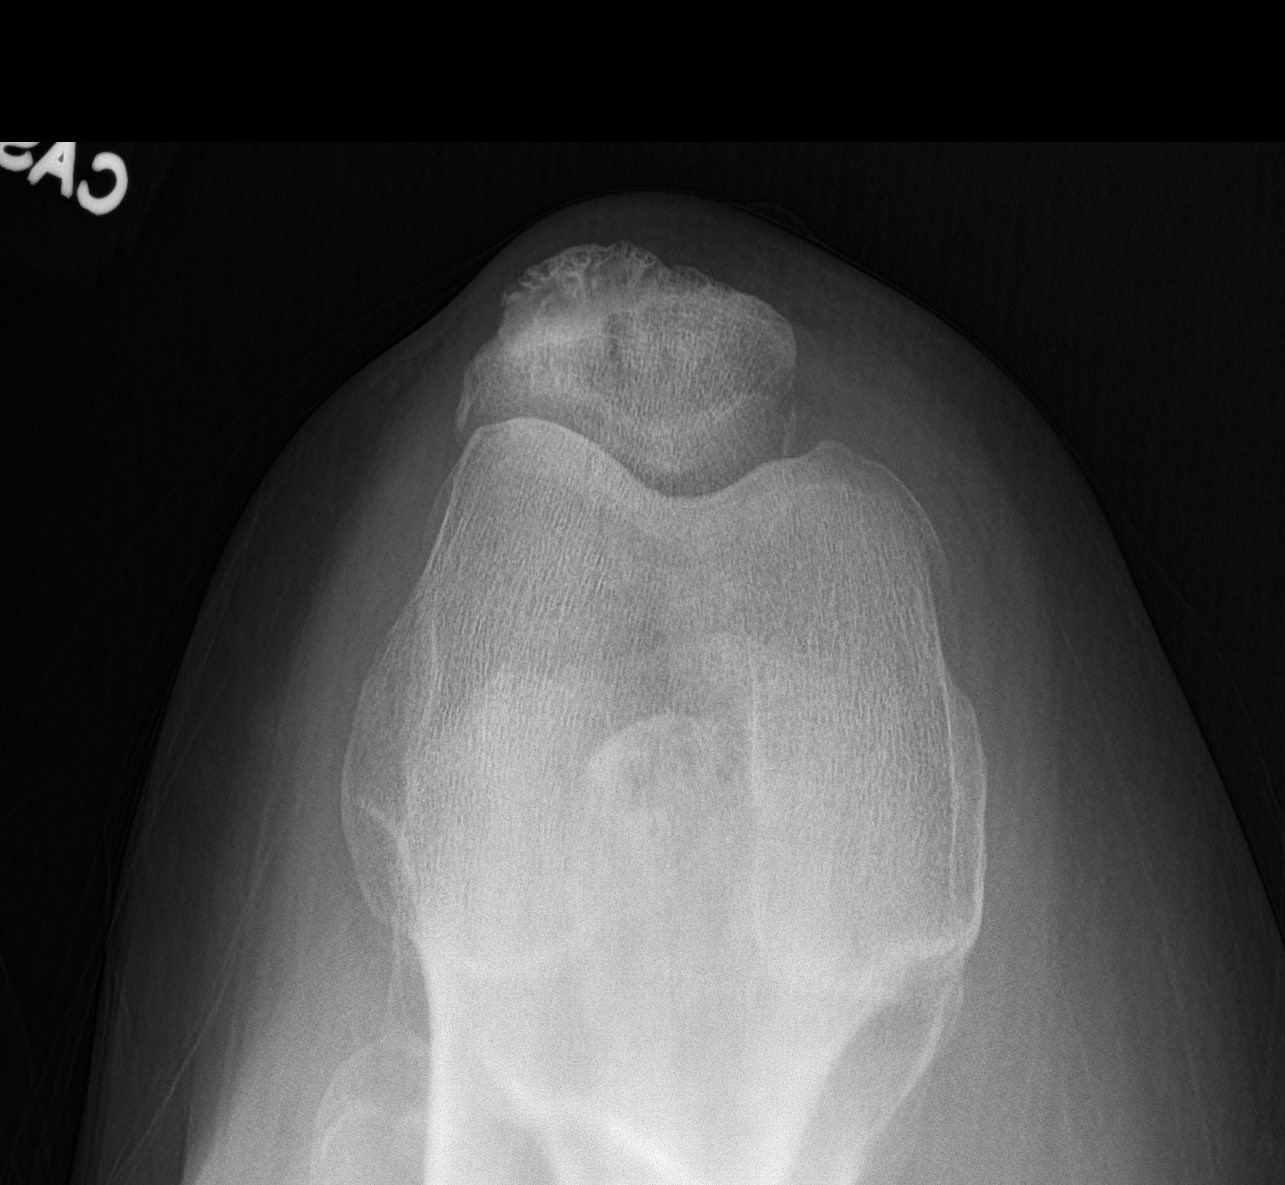

[4 of 4 positions shown; findings below may reference images not displayed]

FINDINGS: Tricompartment degenerative change with loose bodies noted. No acute
bony abnormality identified. No evidence of fracture or dislocation.
Small knee joint effusion. Peripheral vascular calcification.
IMPRESSION: 1. Tricompartment degenerative change with loose bodies. Small knee
joint effusion. No acute bony abnormality identified.

2.  Peripheral vascular disease.

## 2019-06-09 ENCOUNTER — Other Ambulatory Visit: Payer: Self-pay | Admitting: Family Medicine

## 2019-06-09 DIAGNOSIS — I10 Essential (primary) hypertension: Secondary | ICD-10-CM

## 2019-06-09 DIAGNOSIS — E119 Type 2 diabetes mellitus without complications: Secondary | ICD-10-CM

## 2019-06-09 NOTE — Telephone Encounter (Signed)
Requested medication (s) are due for refill today: yes  Requested medication (s) are on the active medication list: yes  Last refill:  03/11/2019  Future visit scheduled:no  Notes to clinic:  no valid encounter within last 6 months   Requested Prescriptions  Pending Prescriptions Disp Refills   glipiZIDE (GLUCOTROL XL) 5 MG 24 hr tablet [Pharmacy Med Name: GLIPIZIDE ER TABS '5MG'$ ] 90 tablet 3    Sig: TAKE 1 TABLET DAILY      Endocrinology:  Diabetes - Sulfonylureas Failed - 06/09/2019 12:14 AM      Failed - HBA1C is between 0 and 7.9 and within 180 days    Hemoglobin A1C  Date Value Ref Range Status  03/20/2018 6.6 (A) 4.0 - 5.6 % Final  04/21/2014 7.2  Final   Hgb A1c MFr Bld  Date Value Ref Range Status  01/16/2017 6.1 (H) 4.8 - 5.6 % Final    Comment:             Prediabetes: 5.7 - 6.4          Diabetes: >6.4          Glycemic control for adults with diabetes: <7.0           Failed - Valid encounter within last 6 months    Recent Outpatient Visits           11 months ago Arthritis of left knee   [place], Vickki Muff, [place]   1 year ago Acute pain of left knee   [facility], [place], [place]   1 year ago Type 2 diabetes mellitus without complication, without long-term current use of insulin ([place])   [facility], [place], [place]   1 year ago Seasonal allergic rhinitis due to pollen   [facility], Dionne Bucy, [place]   2 years ago Type 2 diabetes mellitus without complication, without long-term current use of insulin ([place])   [place], [place], [place]                metFORMIN (GLUCOPHAGE) 500 MG tablet [Pharmacy Med Name: METFORMIN HCL TABS '500MG'$ ] 180 tablet 3    Sig: TAKE 1 TABLET TWICE A DAY      Endocrinology:  Diabetes - Biguanides Failed - 06/09/2019 12:14 AM      Failed - Cr in normal range and within 360 days    Creatinine, Ser  Date Value Ref Range  Status  03/20/2018 0.83 0.57 - 1.00 mg/dL Final   Creatinine, POC  Date Value Ref Range Status  07/16/2016 NA mg/dL Final          Failed - HBA1C is between 0 and 7.9 and within 180 days    Hemoglobin A1C  Date Value Ref Range Status  03/20/2018 6.6 (A) 4.0 - 5.6 % Final  04/21/2014 7.2  Final   Hgb A1c MFr Bld  Date Value Ref Range Status  01/16/2017 6.1 (H) 4.8 - 5.6 % Final    Comment:             Prediabetes: 5.7 - 6.4          Diabetes: >6.4          Glycemic control for adults with diabetes: <7.0           Failed - eGFR in normal range and within 360 days    GFR calc Af Amer  Date Value Ref Range Status  03/20/2018  85 >59 mL/min/1.73 Final   GFR calc non Af Amer  Date Value Ref Range Status  03/20/2018 74 >59 mL/min/1.73 Final          Failed - Valid encounter within last 6 months    Recent Outpatient Visits           11 months ago Arthritis of left knee   [place], [place]   1 year ago Acute pain of left knee   [place], [place], [place]   1 year ago Type 2 diabetes mellitus without complication, without long-term current use of insulin ([place])   [facility], [place], [place]   1 year ago Seasonal allergic rhinitis due to pollen   [facility], Dionne Bucy, [place]   2 years ago Type 2 diabetes mellitus without complication, without long-term current use of insulin [facility])   [place], Vickki Muff, [place]

## 2019-06-09 NOTE — Telephone Encounter (Signed)
Attempted to call patient for appointment- left message to call office to schedule. Courtesy refill given.

## 2019-07-22 ENCOUNTER — Telehealth: Payer: Self-pay | Admitting: Family Medicine

## 2019-07-22 DIAGNOSIS — E785 Hyperlipidemia, unspecified: Secondary | ICD-10-CM

## 2019-07-22 NOTE — Telephone Encounter (Signed)
Requested Prescriptions  Pending Prescriptions Disp Refills  . colesevelam (WELCHOL) 625 MG tablet [Pharmacy Med Name: COLESEVELAM HCL TABS 625MG ] 180 tablet 0    Sig: TAKE 3 TABLETS TWICE A DAY     Cardiovascular:  Antilipid - Bile Acid Sequestrants Failed - 07/22/2019 12:16 AM      Failed - Total Cholesterol in normal range and within 360 days    Cholesterol, Total  Date Value Ref Range Status  03/20/2018 193 100 - 199 mg/dL Final         Failed - LDL in normal range and within 360 days    LDL Calculated  Date Value Ref Range Status  03/20/2018 103 (H) 0 - 99 mg/dL Final         Failed - HDL in normal range and within 360 days    HDL  Date Value Ref Range Status  03/20/2018 56 >39 mg/dL Final         Failed - Triglycerides in normal range and within 360 days    Triglycerides  Date Value Ref Range Status  03/20/2018 172 (H) 0 - 149 mg/dL Final         Failed - Valid encounter within last 12 months    Recent Outpatient Visits          1 year ago Arthritis of left knee   [facility] [place], [place], [place]   1 year ago Acute pain of left knee   [place], [place], [place]   1 year ago Type 2 diabetes mellitus without complication, without long-term current use of insulin (HCC)   [place], [place], [place]   1 year ago Seasonal allergic rhinitis due to pollen   [facility], [facility], [place]   2 years ago Type 2 diabetes mellitus without complication, without long-term current use of insulin [facility])   [facility] [place], [place], [place]

## 2019-07-22 NOTE — Telephone Encounter (Signed)
Tried calling patient. Left message to call back. OK for [facility] to advise patient as below and schedule follow up appointment.

## 2019-09-03 NOTE — Progress Notes (Signed)
Established patient visit  I,Elena D DeSanto,acting as a scribe for [employer], PA.,have documented all relevant documentation on the behalf of Dortha Kern, PA,as directed by  [employer], PA while in the presence of [employer], [place].     Patient: Tammy Stevenson   DOB: 1950-07-31   69 y.o. Female  MRN: [id number] Visit Date: 09/06/2019  Today's healthcare provider: Dortha Kern, PA  Subjective:    Chief Complaint  Patient presents with  . Diabetes  . Hypertension   HPI -Due for Mammogram  -Eye Exam  -Dexa Due  -Foot Exam Due  Diabetes Mellitus Type II, Follow-up  Lab Results  Component Value Date   HGBA1C 6.6 (A) 03/20/2018   HGBA1C 6.1 (H) 01/16/2017   HGBA1C 6.9 (H) 07/16/2016   Last seen for diabetes 2 years ago.  Management since then includes none. She reports good compliance with treatment. She is not having side effects.  Symptoms:  Home blood sugar records: 64-126  Episodes of hypoglycemia? Yes, at 56 she is symptomatic   Current insulin regiment: none Most Recent Eye Exam: Patient is due  Patient reports her blood sugar drops some.  She denies skipping meals.  She reports that she has been out in the yard.  The daughter states that she does take breaks and does eat snacks between meals.    The daughter reports that her lower glucose readings are usually in the mornings.  Pertinent Labs: Lab Results  Component Value Date   CHOL 193 03/20/2018   LDLCALC 103 (H) 03/20/2018   HDL 56 03/20/2018   TRIG 172 (H) 03/20/2018   NA 139 03/20/2018   K 4.4 03/20/2018   ALT 14 03/20/2018   AST 20 03/20/2018   CREATININE 0.83 03/20/2018   PLT 184 03/20/2018   Wt Readings from Last 3 Encounters:  09/06/19 188 lb (85.3 kg)  06/15/18 201 lb 3.2 oz (91.3 kg)  04/03/18 186 lb 3.2 oz (84.5 kg)    -----------------------------------------------------------------------------------------  Past Medical History:  Diagnosis Date  . Diabetes  [facility])    Past Surgical History:  Procedure Laterality Date  . ANKLE SURGERY Right    Social History   Socioeconomic History  . Marital status: Married    Spouse name: Not on file  . Number of children: Not on file  . Years of education: Not on file  . Highest education level: Not on file  Occupational History  . Not on file  Tobacco Use  . Smoking status: Never Smoker  . Smokeless tobacco: Current User  Substance and Sexual Activity  . Alcohol use: No    Alcohol/week: 0.0 standard drinks  . Drug use: No  . Sexual activity: Not on file  Other Topics Concern  . Not on file  Social History Narrative  . Not on file   Social Determinants of Health   Financial Resource Strain:   . Difficulty of Paying Living Expenses:   Food Insecurity:   . Worried About Programme researcher, broadcasting/film/video in the Last Year:   . Barista in the Last Year:   Transportation Needs:   . Freight forwarder (Medical):   Marland Kitchen Lack of Transportation (Non-Medical):   Physical Activity:   . Days of Exercise per Week:   . Minutes of Exercise per Session:   Stress:   . Feeling of Stress :   Social Connections:   . Frequency of Communication with Friends and Family:   . Frequency of Social  Gatherings with Friends and Family:   . Attends Religious Services:   . Active Member of Clubs or Organizations:   . Attends Banker Meetings:   Marland Kitchen Marital Status:   Intimate Partner Violence:   . Fear of Current or Ex-Partner:   . Emotionally Abused:   Marland Kitchen Physically Abused:   . Sexually Abused:    Family History  Problem Relation Age of Onset  . Heart disease Mother   . Stroke Mother   . Hypertension Father   . Lupus Sister    Allergies  Allergen Reactions  . Azithromycin Hives and Rash       Medications: Outpatient Medications Prior to Visit  Medication Sig  . albuterol (PROAIR HFA) 108 (90 Base) MCG/ACT inhaler Inhale into the lungs.   . colesevelam (WELCHOL) 625 MG tablet TAKE 3 TABLETS  TWICE A DAY  . glipiZIDE (GLUCOTROL XL) 5 MG 24 hr tablet TAKE 1 TABLET DAILY  . glucose blood (FREESTYLE LITE) test strip Test blood sugar once a day before breakfast.  . losartan (COZAAR) 50 MG tablet TAKE 1 TABLET BY MOUTH ONCE DAY  . metFORMIN (GLUCOPHAGE) 500 MG tablet TAKE 1 TABLET TWICE A DAY  . fluticasone (FLONASE) 50 MCG/ACT nasal spray Place 2 sprays into both nostrils daily.  . Fluticasone-Salmeterol (ADVAIR DISKUS) 250-50 MCG/DOSE AEPB Inhale into the lungs.  . naproxen (EC NAPROSYN) 500 MG EC tablet Take 1 tablet (500 mg total) by mouth 2 (two) times daily with a meal.   No facility-administered medications prior to visit.    Review of Systems  Respiratory: Positive for cough (dry). Negative for shortness of breath.   Cardiovascular: Negative for chest pain and leg swelling.        Objective:    BP (!) 198/96 (BP Location: Right Arm, Patient Position: Sitting, Cuff Size: Normal)   Pulse (!) 56   Temp (!) 96.2 F (35.7 C) (Skin)   Wt 188 lb (85.3 kg)   SpO2 94%   BMI 32.27 kg/m    Physical Exam Constitutional:      Appearance: Normal appearance.  HENT:     Right Ear: Tympanic membrane, ear canal and external ear normal.     Left Ear: Tympanic membrane, ear canal and external ear normal.     Ears:     Comments: Wearing hearing aids but daughter does not think it is working well. Cardiovascular:     Rate and Rhythm: Normal rate and regular rhythm.  Pulmonary:     Effort: Pulmonary effort is normal.     Breath sounds: Normal breath sounds.  Abdominal:     General: Abdomen is flat. Bowel sounds are normal.     Palpations: Abdomen is soft.  Neurological:     Mental Status: She is alert.     Diabetic Foot Exam - Simple   Simple Foot Form Diabetic Foot exam was performed with the following findings: Yes 09/06/2019  8:43 AM  Visual Inspection No deformities, no ulcerations, no other skin breakdown bilaterally: Yes Sensation Testing Intact to touch and  monofilament testing bilaterally: Yes Pulse Check Posterior Tibialis and Dorsalis pulse intact bilaterally: Yes Comments    No results found for any visits on 09/06/19.    Assessment & Plan:     Problem List Items Addressed This Visit      Cardiovascular and Mediastinum   Essential hypertension    Increase to Losartan to 100  Mg today. Watch sodium in diet. Follow up in 1 month  Relevant Medications   losartan (COZAAR) 100 MG tablet   Other Relevant Orders   CBC with Differential/Platelet   Comprehensive metabolic panel   Lipid Panel With LDL/HDL Ratio   TSH     Endocrine   Type 2 diabetes mellitus ([place]) - Primary    Foot exam - no peripheral neuropathy today.  Has thick nails first 3 toes bilaterally. Should follow up with podiatrist. Recommend eye exam.   Obtaining microalbumin and A1C today. Eat regular with snacks to avoid low glucose readings. May need to decrease medications pending labs.      Relevant Medications   losartan (COZAAR) 100 MG tablet   Other Relevant Orders   CBC with Differential/Platelet   Comprehensive metabolic panel   Hemoglobin A1c   Lipid Panel With LDL/HDL Ratio   Microalbumin, urine     Nervous and Auditory   Bilateral hearing loss    Great difficulty with hearing.   May need new hearing aids.      Relevant Orders   Ambulatory referral to Audiology     Other   HLD (hyperlipidemia)    Obtain labs today. Level stable in the past, may be able to decrease this pending labs.      Relevant Medications   losartan (COZAAR) 100 MG tablet   Other Relevant Orders   Lipid Panel With LDL/HDL Ratio    Other Visit Diagnoses    Screening for osteoporosis       Relevant Orders   DG Bone Density   Menopausal state       Relevant Orders   DG Bone Density          Screening for breast cancer          Refuses mammograms.    Andres Shad, PA, have reviewed all documentation for this visit. The documentation on 09/06/19 for  the exam, diagnosis, procedures, and orders are all accurate and complete.    Vernie Murders, [place] [phone] (phone) [phone] (fax)  [place]

## 2019-09-06 ENCOUNTER — Ambulatory Visit (INDEPENDENT_AMBULATORY_CARE_PROVIDER_SITE_OTHER): Payer: BC Managed Care – PPO | Admitting: Family Medicine

## 2019-09-06 ENCOUNTER — Other Ambulatory Visit: Payer: Self-pay

## 2019-09-06 ENCOUNTER — Encounter: Payer: Self-pay | Admitting: Family Medicine

## 2019-09-06 VITALS — BP 178/98 | HR 56 | Temp 96.2°F | Wt 188.0 lb

## 2019-09-06 DIAGNOSIS — I10 Essential (primary) hypertension: Secondary | ICD-10-CM

## 2019-09-06 DIAGNOSIS — N951 Menopausal and female climacteric states: Secondary | ICD-10-CM

## 2019-09-06 DIAGNOSIS — E119 Type 2 diabetes mellitus without complications: Secondary | ICD-10-CM

## 2019-09-06 DIAGNOSIS — E782 Mixed hyperlipidemia: Secondary | ICD-10-CM

## 2019-09-06 DIAGNOSIS — H9193 Unspecified hearing loss, bilateral: Secondary | ICD-10-CM | POA: Diagnosis not present

## 2019-09-06 DIAGNOSIS — Z13820 Encounter for screening for osteoporosis: Secondary | ICD-10-CM

## 2019-09-06 MED ORDER — LOSARTAN POTASSIUM 100 MG PO TABS: 100.0000 mg | ORAL_TABLET | Freq: Every day | ORAL | 3 refills | Status: DC

## 2019-09-06 NOTE — Patient Instructions (Addendum)
Recommend getting foot checked by podiatry for toenails Get diabetic eye exam May need to get hearing aids checked  May want to follow up with orthopedics for knee pain.   Arthritis Arthritis means joint pain. It can also mean joint disease. A joint is a place where bones come together. There are more than 100 types of arthritis. What are the causes? This condition may be caused by:  Wear and tear of a joint. This is the most common cause.  A lot of acid in the blood, which leads to pain in the joint (gout).  Pain and swelling (inflammation) in a joint.  Infection of a joint.  Injuries in the joint.  A reaction to medicines (allergy). In some cases, the cause may not be known. What are the signs or symptoms? Symptoms of this condition include:  Redness at a joint.  Swelling at a joint.  Stiffness at a joint.  Warmth coming from the joint.  A fever.  A feeling of being sick. How is this treated? This condition may be treated with:  Treating the cause, if it is known.  Rest.  Raising (elevating) the joint.  Putting cold or hot packs on the joint.  Medicines to treat symptoms and reduce pain and swelling.  Shots of medicines (cortisone) into the joint. You may also be told to make changes in your life, such as doing exercises and losing weight. Follow these instructions at home: Medicines  Take over-the-counter and prescription medicines only as told by your doctor.  Do not take aspirin for pain if your doctor says that you may have gout. Activity  Rest your joint if your doctor tells you to.  Avoid activities that make the pain worse.  Exercise your joint regularly as told by your doctor. Try doing exercises like: ? Swimming. ? Water aerobics. ? Biking. ? Walking. Managing pain, stiffness, and swelling      If told, put ice on the affected area. ? Put ice in a plastic bag. ? Place a towel between your skin and the bag. ? Leave the ice on for  20 minutes, 2-3 times per day.  If your joint is swollen, raise (elevate) it above the level of your heart if told by your doctor.  If your joint feels stiff in the morning, try taking a warm shower.  If told, put heat on the affected area. Do this as often as told by your doctor. Use the heat source that your doctor recommends, such as a moist heat pack or a heating pad. If you have diabetes, do not apply heat without asking your doctor. To apply heat: ? Place a towel between your skin and the heat source. ? Leave the heat on for 20-30 minutes. ? Remove the heat if your skin turns bright red. This is very important if you are unable to feel pain, heat, or cold. You may have a greater risk of getting burned. General instructions  Do not use any products that contain nicotine or tobacco, such as cigarettes, e-cigarettes, and chewing tobacco. If you need help quitting, ask your doctor.  Keep all follow-up visits as told by your doctor. This is important. Contact a doctor if:  The pain gets worse.  You have a fever. Get help right away if:  You have very bad pain in your joint.  You have swelling in your joint.  Your joint is red.  Many joints become painful and swollen.  You have very bad back pain.  Your leg is very weak.  You cannot control your pee (urine) or poop (stool). Summary  Arthritis means joint pain. It can also mean joint disease. A joint is a place where bones come together.  The most common cause of this condition is wear and tear of a joint.  Symptoms of this condition include redness, swelling, or stiffness of the joint.  This condition is treated with rest, raising the joint, medicines, and putting cold or hot packs on the joint.  Follow your doctor's instructions about medicines, activity, exercises, and other home care treatments. This information is not intended to replace advice given to you by your health care provider. Make sure you discuss any  questions you have with your health care provider. Document Revised: 04/13/2018 Document Reviewed: 04/13/2018 Elsevier Patient Education  2020 [employer] Inc[employer]   DASH Eating Plan DASH stands for "Dietary Approaches to Stop Hypertension." The DASH eating plan is a healthy eating plan that has been shown to reduce high blood pressure (hypertension). It may also reduce your risk for type 2 diabetes, heart disease, and stroke. The DASH eating plan may also help with weight loss. What are tips for following this plan?  General guidelines  Avoid eating more than 2,300 mg (milligrams) of salt (sodium) a day. If you have hypertension, you may need to reduce your sodium intake to 1,500 mg a day.  Limit alcohol intake to no more than 1 drink a day for nonpregnant women and 2 drinks a day for men. One drink equals 12 oz of beer, 5 oz of wine, or 1 oz of hard liquor.  Work with your health care provider to maintain a healthy body weight or to lose weight. Ask what an ideal weight is for you.  Get at least 30 minutes of exercise that causes your heart to beat faster (aerobic exercise) most days of the week. Activities may include walking, swimming, or biking.  Work with your health care provider or diet and nutrition specialist (dietitian) to adjust your eating plan to your individual calorie needs. Reading food labels   Check food labels for the amount of sodium per serving. Choose foods with less than 5 percent of the Daily Value of sodium. Generally, foods with less than 300 mg of sodium per serving fit into this eating plan.  To find whole grains, look for the word "whole" as the first word in the ingredient list. Shopping  Buy products labeled as "low-sodium" or "no salt added."  Buy fresh foods. Avoid canned foods and premade or frozen meals. Cooking  Avoid adding salt when cooking. Use salt-free seasonings or herbs instead of table salt or sea salt. Check with your health care provider or  pharmacist before using salt substitutes.  Do not fry foods. Cook foods using healthy methods such as baking, boiling, grilling, and broiling instead.  Cook with heart-healthy oils, such as olive, canola, soybean, or sunflower oil. Meal planning  Eat a balanced diet that includes: ? 5 or more servings of fruits and vegetables each day. At each meal, try to fill half of your plate with fruits and vegetables. ? Up to 6-8 servings of whole grains each day. ? Less than 6 oz of lean meat, poultry, or fish each day. A 3-oz serving of meat is about the same size as a deck of cards. One egg equals 1 oz. ? 2 servings of low-fat dairy each day. ? A serving of nuts, seeds, or beans 5 times each week. ? Heart-healthy  fats. Healthy fats called Omega-3 fatty acids are found in foods such as flaxseeds and coldwater fish, like sardines, salmon, and mackerel.  Limit how much you eat of the following: ? Canned or prepackaged foods. ? Food that is high in trans fat, such as fried foods. ? Food that is high in saturated fat, such as fatty meat. ? Sweets, desserts, sugary drinks, and other foods with added sugar. ? Full-fat dairy products.  Do not salt foods before eating.  Try to eat at least 2 vegetarian meals each week.  Eat more home-cooked food and less restaurant, buffet, and fast food.  When eating at a restaurant, ask that your food be prepared with less salt or no salt, if possible. What foods are recommended? The items listed may not be a complete list. Talk with your dietitian about what dietary choices are best for you. Grains Whole-grain or whole-wheat bread. Whole-grain or whole-wheat pasta. Brown rice. Orpah Cobb. Bulgur. Whole-grain and low-sodium cereals. Pita bread. Low-fat, low-sodium crackers. Whole-wheat flour tortillas. Vegetables Fresh or frozen vegetables (raw, steamed, roasted, or grilled). Low-sodium or reduced-sodium tomato and vegetable juice. Low-sodium or  reduced-sodium tomato sauce and tomato paste. Low-sodium or reduced-sodium canned vegetables. Fruits All fresh, dried, or frozen fruit. Canned fruit in natural juice (without added sugar). Meat and other protein foods Skinless chicken or [place]. Ground chicken or [place]. Pork with fat trimmed off. Fish and seafood. Egg whites. Dried beans, peas, or lentils. Unsalted nuts, nut butters, and seeds. Unsalted canned beans. Lean cuts of beef with fat trimmed off. Low-sodium, lean deli meat. Dairy Low-fat (1%) or fat-free (skim) milk. Fat-free, low-fat, or reduced-fat cheeses. Nonfat, low-sodium ricotta or cottage cheese. Low-fat or nonfat yogurt. Low-fat, low-sodium cheese. Fats and oils Soft margarine without trans fats. Vegetable oil. Low-fat, reduced-fat, or light mayonnaise and salad dressings (reduced-sodium). Canola, safflower, olive, soybean, and sunflower oils. Avocado. Seasoning and other foods Herbs. Spices. Seasoning mixes without salt. Unsalted popcorn and pretzels. Fat-free sweets. What foods are not recommended? The items listed may not be a complete list. Talk with your dietitian about what dietary choices are best for you. Grains Baked goods made with fat, such as croissants, muffins, or some breads. Dry pasta or rice meal packs. Vegetables Creamed or fried vegetables. Vegetables in a cheese sauce. Regular canned vegetables (not low-sodium or reduced-sodium). Regular canned tomato sauce and paste (not low-sodium or reduced-sodium). Regular tomato and vegetable juice (not low-sodium or reduced-sodium). Rosita Fire. Olives. Fruits Canned fruit in a light or heavy syrup. Fried fruit. Fruit in cream or butter sauce. Meat and other protein foods Fatty cuts of meat. Ribs. Fried meat. Tomasa Blase. Sausage. Bologna and other processed lunch meats. Salami. Fatback. Hotdogs. Bratwurst. Salted nuts and seeds. Canned beans with added salt. Canned or smoked fish. Whole eggs or egg yolks. Chicken or [place]  with skin. Dairy Whole or 2% milk, cream, and half-and-half. Whole or full-fat cream cheese. Whole-fat or sweetened yogurt. Full-fat cheese. Nondairy creamers. Whipped toppings. Processed cheese and cheese spreads. Fats and oils Butter. Stick margarine. Lard. Shortening. Ghee. Bacon fat. Tropical oils, such as coconut, palm kernel, or palm oil. Seasoning and other foods Salted popcorn and pretzels. Onion salt, garlic salt, seasoned salt, table salt, and sea salt. Worcestershire sauce. Tartar sauce. Barbecue sauce. Teriyaki sauce. Soy sauce, including reduced-sodium. Steak sauce. Canned and packaged gravies. Fish sauce. Oyster sauce. Cocktail sauce. Horseradish that you find on the shelf. Ketchup. Mustard. Meat flavorings and tenderizers. Bouillon cubes. Hot sauce and Tabasco sauce. Premade  or packaged marinades. Premade or packaged taco seasonings. Relishes. Regular salad dressings. Where to find more information:  National Heart, Lung, and Blood Institute: PopSteam.is  American Heart Association: www.heart.org Summary  The DASH eating plan is a healthy eating plan that has been shown to reduce high blood pressure (hypertension). It may also reduce your risk for type 2 diabetes, heart disease, and stroke.  With the DASH eating plan, you should limit salt (sodium) intake to 2,300 mg a day. If you have hypertension, you may need to reduce your sodium intake to 1,500 mg a day.  When on the DASH eating plan, aim to eat more fresh fruits and vegetables, whole grains, lean proteins, low-fat dairy, and heart-healthy fats.  Work with your health care provider or diet and nutrition specialist (dietitian) to adjust your eating plan to your individual calorie needs. This information is not intended to replace advice given to you by your health care provider. Make sure you discuss any questions you have with your health care provider. Document Revised: 04/18/2017 Document Reviewed:  04/29/2016 Elsevier Patient Education  2020 [employer].

## 2019-09-06 NOTE — Assessment & Plan Note (Addendum)
Increase to Losartan to 100  Mg today. Watch sodium in diet. Follow up in 1 month

## 2019-09-06 NOTE — Assessment & Plan Note (Signed)
Obtain labs today. Level stable in the past, may be able to decrease this pending labs.

## 2019-09-06 NOTE — Assessment & Plan Note (Addendum)
Foot exam normal today.   Recommend eye exam.   Obtaining microalbumin and A1C today. Eat regular with snacks to avoid low glucose readings. May need to decrease medications pending labs.

## 2019-09-06 NOTE — Assessment & Plan Note (Signed)
Great difficulty with hearing.   May need new hearing aids.

## 2019-09-07 DIAGNOSIS — E119 Type 2 diabetes mellitus without complications: Secondary | ICD-10-CM | POA: Diagnosis not present

## 2019-09-07 LAB — COMPREHENSIVE METABOLIC PANEL
ALT: 17 IU/L (ref 0–32)
AST: 23 IU/L (ref 0–40)
Albumin/Globulin Ratio: 2 (ref 1.2–2.2)
Albumin: 4.7 g/dL (ref 3.8–4.8)
Alkaline Phosphatase: 81 IU/L (ref 39–117)
BUN/Creatinine Ratio: 8 — ABNORMAL LOW (ref 12–28)
BUN: 7 mg/dL — ABNORMAL LOW (ref 8–27)
Bilirubin Total: 0.7 mg/dL (ref 0.0–1.2)
CO2: 23 mmol/L (ref 20–29)
Calcium: 9.5 mg/dL (ref 8.7–10.3)
Chloride: 101 mmol/L (ref 96–106)
Creatinine, Ser: 0.88 mg/dL (ref 0.57–1.00)
GFR calc Af Amer: 79 mL/min/{1.73_m2} (ref >59)
GFR calc non Af Amer: 68 mL/min/{1.73_m2} (ref >59)
Globulin, Total: 2.4 g/dL (ref 1.5–4.5)
Glucose: 134 mg/dL — ABNORMAL HIGH (ref 65–99)
Potassium: 4.6 mmol/L (ref 3.5–5.2)
Sodium: 138 mmol/L (ref 134–144)
Total Protein: 7.1 g/dL (ref 6.0–8.5)

## 2019-09-07 LAB — LIPID PANEL WITH LDL/HDL RATIO
Cholesterol, Total: 175 mg/dL (ref 100–199)
HDL: 57 mg/dL (ref >39)
LDL Chol Calc (NIH): 94 mg/dL (ref 0–99)
LDL/HDL Ratio: 1.6 ratio (ref 0.0–3.2)
Triglycerides: 140 mg/dL (ref 0–149)
VLDL Cholesterol Cal: 24 mg/dL (ref 5–40)

## 2019-09-07 LAB — CBC WITH DIFFERENTIAL/PLATELET
Basophils Absolute: 0.1 10*3/uL (ref 0.0–0.2)
Basos: 1 %
EOS (ABSOLUTE): 0.1 10*3/uL (ref 0.0–0.4)
Eos: 1 %
Hematocrit: 44.1 % (ref 34.0–46.6)
Hemoglobin: 13.7 g/dL (ref 11.1–15.9)
Immature Grans (Abs): 0 10*3/uL (ref 0.0–0.1)
Immature Granulocytes: 0 %
Lymphocytes Absolute: 4.6 10*3/uL — ABNORMAL HIGH (ref 0.7–3.1)
Lymphs: 52 %
MCH: 27.1 pg (ref 26.6–33.0)
MCHC: 31.1 g/dL — ABNORMAL LOW (ref 31.5–35.7)
MCV: 87 fL (ref 79–97)
Monocytes Absolute: 0.3 10*3/uL (ref 0.1–0.9)
Monocytes: 3 %
Neutrophils Absolute: 3.9 10*3/uL (ref 1.4–7.0)
Neutrophils: 43 %
Platelets: 204 10*3/uL (ref 150–450)
RBC: 5.06 x10E6/uL (ref 3.77–5.28)
RDW: 13.9 % (ref 11.7–15.4)
WBC: 9 10*3/uL (ref 3.4–10.8)

## 2019-09-07 LAB — MICROALBUMIN, URINE

## 2019-09-07 LAB — HEMOGLOBIN A1C
Est. average glucose Bld gHb Est-mCnc: 163 mg/dL
Hgb A1c MFr Bld: 7.3 % — ABNORMAL HIGH (ref 4.8–5.6)

## 2019-09-07 LAB — TSH: TSH: 2.63 u[IU]/mL (ref 0.450–4.500)

## 2019-09-09 LAB — MICROALBUMIN, URINE: Microalbumin, Urine: 42.8 ug/mL

## 2019-09-28 ENCOUNTER — Inpatient Hospital Stay: Admission: RE | Admit: 2019-09-28 | Payer: BLUE CROSS/BLUE SHIELD | Source: Ambulatory Visit

## 2019-10-01 NOTE — Progress Notes (Deleted)
Complete physical exam   Patient: Tammy Stevenson   DOB: 10-04-1950   69 y.o. Female  MRN: [id number] Visit Date: 10/04/2019  Today's healthcare provider: Dortha Kern, PA   No chief complaint on file.  Subjective    Tammy Stevenson is a 69 y.o. female who presents today for a complete physical exam.  She reports consuming a {diet types:17450} diet. {Exercise:19826} She generally feels {well/fairly well/poorly:18703}. She reports sleeping {well/fairly well/poorly:18703}. She {does/does not:200015} have additional problems to discuss today.  HPI   Immunization History  Administered Date(s) Administered  . Pneumococcal Conjugate-13 01/16/2017  . Pneumococcal Polysaccharide-23 03/20/2018  . Tdap 01/16/2017     Past Medical History:  Diagnosis Date  . Diabetes [facility])    Past Surgical History:  Procedure Laterality Date  . ANKLE SURGERY Right    Social History   Socioeconomic History  . Marital status: Married    Spouse name: Not on file  . Number of children: Not on file  . Years of education: Not on file  . Highest education level: Not on file  Occupational History  . Not on file  Tobacco Use  . Smoking status: Never Smoker  . Smokeless tobacco: Current User  Substance and Sexual Activity  . Alcohol use: No    Alcohol/week: 0.0 standard drinks  . Drug use: No  . Sexual activity: Not on file  Other Topics Concern  . Not on file  Social History Narrative  . Not on file   Social Determinants of Health   Financial Resource Strain:   . Difficulty of Paying Living Expenses:   Food Insecurity:   . Worried About Programme researcher, broadcasting/film/video in the Last Year:   . Barista in the Last Year:   Transportation Needs:   . Freight forwarder (Medical):   Marland Kitchen Lack of Transportation (Non-Medical):   Physical Activity:   . Days of Exercise per Week:   . Minutes of Exercise per Session:   Stress:   . Feeling of Stress :   Social Connections:   . Frequency of  Communication with Friends and Family:   . Frequency of Social Gatherings with Friends and Family:   . Attends Religious Services:   . Active Member of Clubs or Organizations:   . Attends Banker Meetings:   Marland Kitchen Marital Status:   Intimate Partner Violence:   . Fear of Current or Ex-Partner:   . Emotionally Abused:   Marland Kitchen Physically Abused:   . Sexually Abused:    Family Status  Relation Name Status  . Mother  Deceased  . Father  Deceased  . Sister  (Not Specified)   Family History  Problem Relation Age of Onset  . Heart disease Mother   . Stroke Mother   . Hypertension Father   . Lupus Sister    Allergies  Allergen Reactions  . Azithromycin Hives and Rash    Patient Care Team: Chrismon, Jodell Cipro, PA as PCP - General (Family Medicine)   Medications: Outpatient Medications Prior to Visit  Medication Sig  . albuterol (PROAIR HFA) 108 (90 Base) MCG/ACT inhaler Inhale into the lungs.   . colesevelam (WELCHOL) 625 MG tablet TAKE 3 TABLETS TWICE A DAY  . fluticasone (FLONASE) 50 MCG/ACT nasal spray Place 2 sprays into both nostrils daily.  . Fluticasone-Salmeterol (ADVAIR DISKUS) 250-50 MCG/DOSE AEPB Inhale into the lungs.  Marland Kitchen glipiZIDE (GLUCOTROL XL) 5 MG 24 hr tablet TAKE 1 TABLET DAILY  .  glucose blood (FREESTYLE LITE) test strip Test blood sugar once a day before breakfast.  . losartan (COZAAR) 100 MG tablet Take 1 tablet (100 mg total) by mouth daily.  . metFORMIN (GLUCOPHAGE) 500 MG tablet TAKE 1 TABLET TWICE A DAY  . naproxen (EC NAPROSYN) 500 MG EC tablet Take 1 tablet (500 mg total) by mouth 2 (two) times daily with a meal.   No facility-administered medications prior to visit.    Review of Systems  Constitutional: Negative.   HENT: Negative.   Eyes: Negative.   Respiratory: Negative.   Cardiovascular: Negative.   Gastrointestinal: Negative.   Endocrine: Negative.   Genitourinary: Negative.   Musculoskeletal: Negative.   Skin: Negative.     Allergic/Immunologic: Negative.   Neurological: Negative.   Hematological: Negative.   Psychiatric/Behavioral: Negative.       Objective    There were no vitals taken for this visit.  Physical Exam Constitutional:      Appearance: Normal appearance. She is normal weight.  HENT:     Head: Normocephalic and atraumatic.     Right Ear: Tympanic membrane, ear canal and external ear normal.     Left Ear: Tympanic membrane, ear canal and external ear normal.     Nose: Nose normal.     Mouth/Throat:     Mouth: Mucous membranes are moist.     Pharynx: Oropharynx is clear.  Eyes:     Extraocular Movements: Extraocular movements intact.     Conjunctiva/sclera: Conjunctivae normal.     Pupils: Pupils are equal, round, and reactive to light.  Cardiovascular:     Rate and Rhythm: Normal rate and regular rhythm.     Pulses: Normal pulses.     Heart sounds: Normal heart sounds.  Pulmonary:     Effort: Pulmonary effort is normal.     Breath sounds: Normal breath sounds.  Abdominal:     General: Abdomen is flat. Bowel sounds are normal.     Palpations: Abdomen is soft.  Musculoskeletal:        General: Normal range of motion.     Cervical back: Normal range of motion and neck supple.  Skin:    General: Skin is warm and dry.  Neurological:     General: No focal deficit present.     Mental Status: She is alert and oriented to person, place, and time. Mental status is at baseline.  Psychiatric:        Mood and Affect: Mood normal.        Behavior: Behavior normal.        Thought Content: Thought content normal.        Judgment: Judgment normal.     Depression Screen  PHQ 2/9 Scores 04/03/2018 03/20/2018 01/16/2017  PHQ - 2 Score 0 0 0  PHQ- 9 Score - - -   Fall Risk  09/06/2019 04/03/2018 03/20/2018 01/16/2017  Falls in the past year? 0 0 0 No     Functional Status Survey:          No results found for any visits on 10/04/19.  Assessment & Plan    Routine Health  Maintenance and Physical Exam  Exercise Activities and Dietary recommendations Goals   None     Immunization History  Administered Date(s) Administered  . Pneumococcal Conjugate-13 01/16/2017  . Pneumococcal Polysaccharide-23 03/20/2018  . Tdap 01/16/2017    Health Maintenance  Topic Date Due  . OPHTHALMOLOGY EXAM  Never done  . COVID-19 Vaccine (1) Never done  .  DEXA SCAN  Never done  . MAMMOGRAM  09/05/2020 (Originally 10/22/2001)  . INFLUENZA VACCINE  03/20/2024 (Originally 12/19/2019)  . COLONOSCOPY  07/16/2026 (Originally 10/22/2001)  . HEMOGLOBIN A1C  03/07/2020  . FOOT EXAM  09/05/2020  . TETANUS/TDAP  01/17/2027  . Hepatitis C Screening  Completed  . PNA vac Low Risk Adult  Completed    Discussed health benefits of physical activity, and encouraged her to engage in regular exercise appropriate for her age and condition.  ***  No follow-ups on file.     {provider attestation***:1}   Dortha Kern, PA  [facility] [phone] (phone) [phone] (fax)  [facility]

## 2019-10-04 ENCOUNTER — Encounter: Payer: Self-pay | Admitting: Family Medicine

## 2019-10-07 DIAGNOSIS — Z6832 Body mass index (BMI) 32.0-32.9, adult: Secondary | ICD-10-CM | POA: Insufficient documentation

## 2019-10-07 NOTE — Assessment & Plan Note (Signed)
Patient due for BMI assessment today

## 2019-10-07 NOTE — Progress Notes (Signed)
Complete physical exam  I,Elena D DeSanto,acting as a scribe for [employer], PA.,have documented all relevant documentation on the behalf of Dortha Kern, PA,as directed by  [employer], PA while in the presence of [employer], [place].   Patient: Tammy Stevenson   DOB: 05-26-1950   69 y.o. Female  MRN: [id number] Visit Date: 10/11/2019  Today's healthcare provider: Dortha Kern, PA   Chief Complaint  Patient presents with  . Annual Exam   Subjective    Tammy Stevenson is a 69 y.o. female who presents today for a complete physical exam.  She reports consuming a low sodium diet. The patient does not participate in regular exercise at present. She generally feels well. She reports sleeping well. She does not have additional problems to discuss today.  HPI    Past Medical History:  Diagnosis Date  . Diabetes [facility])    Past Surgical History:  Procedure Laterality Date  . ANKLE SURGERY Right    Social History   Socioeconomic History  . Marital status: Married    Spouse name: Not on file  . Number of children: Not on file  . Years of education: Not on file  . Highest education level: Not on file  Occupational History  . Not on file  Tobacco Use  . Smoking status: Never Smoker  . Smokeless tobacco: Current User  Substance and Sexual Activity  . Alcohol use: No    Alcohol/week: 0.0 standard drinks  . Drug use: No  . Sexual activity: Not on file  Other Topics Concern  . Not on file  Social History Narrative  . Not on file   Social Determinants of Health   Financial Resource Strain:   . Difficulty of Paying Living Expenses:   Food Insecurity:   . Worried About Programme researcher, broadcasting/film/video in the Last Year:   . Barista in the Last Year:   Transportation Needs:   . Freight forwarder (Medical):   Marland Kitchen Lack of Transportation (Non-Medical):   Physical Activity:   . Days of Exercise per Week:   . Minutes of Exercise per Session:   Stress:   . Feeling  of Stress :   Social Connections:   . Frequency of Communication with Friends and Family:   . Frequency of Social Gatherings with Friends and Family:   . Attends Religious Services:   . Active Member of Clubs or Organizations:   . Attends Banker Meetings:   Marland Kitchen Marital Status:   Intimate Partner Violence:   . Fear of Current or Ex-Partner:   . Emotionally Abused:   Marland Kitchen Physically Abused:   . Sexually Abused:    Family Status  Relation Name Status  . Mother  Deceased  . Father  Deceased  . Sister  (Not Specified)   Family History  Problem Relation Age of Onset  . Heart disease Mother   . Stroke Mother   . Hypertension Father   . Lupus Sister    Allergies  Allergen Reactions  . Azithromycin Hives and Rash    Patient Care Team: Chrismon, Jodell Cipro, PA as PCP - General (Family Medicine)   Medications: Outpatient Medications Prior to Visit  Medication Sig  . albuterol (PROAIR HFA) 108 (90 Base) MCG/ACT inhaler Inhale into the lungs.   . colesevelam (WELCHOL) 625 MG tablet TAKE 3 TABLETS TWICE A DAY  . fluticasone (FLONASE) 50 MCG/ACT nasal spray Place 2 sprays into both nostrils daily.  Marland Kitchen  Fluticasone-Salmeterol (ADVAIR DISKUS) 250-50 MCG/DOSE AEPB Inhale into the lungs.  Marland Kitchen glipiZIDE (GLUCOTROL XL) 5 MG 24 hr tablet TAKE 1 TABLET DAILY  . glucose blood (FREESTYLE LITE) test strip Test blood sugar once a day before breakfast.  . losartan (COZAAR) 100 MG tablet Take 1 tablet (100 mg total) by mouth daily.  . metFORMIN (GLUCOPHAGE) 500 MG tablet TAKE 1 TABLET TWICE A DAY  . naproxen (EC NAPROSYN) 500 MG EC tablet Take 1 tablet (500 mg total) by mouth 2 (two) times daily with a meal.   No facility-administered medications prior to visit.    Review of Systems  Constitutional: Negative.   HENT: Negative.   Eyes: Negative.   Respiratory: Negative.   Cardiovascular: Negative.   Gastrointestinal: Negative.   Endocrine: Negative.   Genitourinary: Negative.    Musculoskeletal: Negative.   Skin: Negative.   Allergic/Immunologic: Negative.   Neurological: Negative.   Hematological: Negative.   Psychiatric/Behavioral: Negative.       Objective    BP (!) 200/72 (BP Location: Right Arm, Patient Position: Sitting, Cuff Size: Normal)   Pulse 76   Temp (!) 97.5 F (36.4 C) (Skin)   Wt 179 lb (81.2 kg)   SpO2 98%   BMI 30.73 kg/m    Physical Exam Constitutional:      Appearance: Normal appearance. She is normal weight.  HENT:     Head: Normocephalic and atraumatic.     Right Ear: Tympanic membrane, ear canal and external ear normal.     Left Ear: Tympanic membrane, ear canal and external ear normal.     Ears:     Comments: Wearing bilateral hearing aids and still has very poor hearing.    Nose: Nose normal.     Mouth/Throat:     Mouth: Mucous membranes are moist.     Pharynx: Oropharynx is clear.  Eyes:     Extraocular Movements: Extraocular movements intact.     Conjunctiva/sclera: Conjunctivae normal.     Pupils: Pupils are equal, round, and reactive to light.  Cardiovascular:     Rate and Rhythm: Normal rate and regular rhythm.     Pulses: Normal pulses.     Heart sounds: Normal heart sounds.  Pulmonary:     Effort: Pulmonary effort is normal.     Breath sounds: Normal breath sounds.  Abdominal:     General: Abdomen is flat. Bowel sounds are normal.     Palpations: Abdomen is soft.  Musculoskeletal:        General: Normal range of motion.     Cervical back: Normal range of motion and neck supple.  Skin:    General: Skin is warm and dry.  Neurological:     General: No focal deficit present.     Mental Status: She is alert and oriented to person, place, and time. Mental status is at baseline.  Psychiatric:        Mood and Affect: Mood normal.        Behavior: Behavior normal.        Thought Content: Thought content normal.        Judgment: Judgment normal.      Depression Screen  PHQ 2/9 Scores 10/11/2019 04/03/2018  03/20/2018  PHQ - 2 Score 0 0 0  PHQ- 9 Score 0 - -   Fall Risk  10/11/2019 09/06/2019 04/03/2018 03/20/2018 01/16/2017  Falls in the past year? 0 0 0 0 No     Functional Status Survey: Is the patient deaf  or have difficulty hearing?: Yes Does the patient have difficulty seeing, even when wearing glasses/contacts?: No Does the patient have difficulty concentrating, remembering, or making decisions?: No Does the patient have difficulty walking or climbing stairs?: No Does the patient have difficulty dressing or bathing?: No Does the patient have difficulty doing errands alone such as visiting a doctor's office or shopping?: No      No results found for any visits on 10/11/19.  Assessment & Plan    Routine Health Maintenance and Physical Exam  Exercise Activities and Dietary recommendations Goals   No specific exercise program but works in her yard most days gardening.     Immunization History  Administered Date(s) Administered  . Pneumococcal Conjugate-13 01/16/2017  . Pneumococcal Polysaccharide-23 03/20/2018  . Tdap 01/16/2017    Health Maintenance  Topic Date Due  . OPHTHALMOLOGY EXAM  Never done  . COVID-19 Vaccine (1) Never done  . DEXA SCAN  Never done  . MAMMOGRAM  09/05/2020 (Originally 10/22/2001)  . INFLUENZA VACCINE  03/20/2024 (Originally 12/19/2019)  . COLONOSCOPY  07/16/2026 (Originally 10/22/2001)  . HEMOGLOBIN A1C  03/07/2020  . FOOT EXAM  09/05/2020  . TETANUS/TDAP  01/17/2027  . Hepatitis C Screening  Completed  . PNA vac Low Risk Adult  Completed    Discussed health benefits of physical activity, and encouraged her to engage in regular exercise appropriate for her age and condition.   Problem List Items Addressed This Visit      Other   BMI 32.0-32.9,adult    Patient due for BMI assessment today. Counseled regarding diet, exercise and weight loss.       Other Visit Diagnoses    Annual physical exam    -  Primarily stable. Refuses COVID  vaccinations.   Colon cancer screening  - Refuses colonoscopy or Cologuard. Asymptomatic.   Encounter for screening mammogram for malignant neoplasm of breast  - Refuses mammograms.      Problem List Items Addressed This Visit      Cardiovascular and Mediastinum   Essential hypertension    Tolerating Losartan 100 mg qd and BP <140/90 at home. Extra anxiety coming into the office and wearing mask. Recheck in 4 months.        Endocrine   Type 2 diabetes mellitus ([place])    No hypoglycemic episodes. Enjoys gardening for exercise. Continue Metformin 500 mg qd and Glipizide 5 mg qd with diabetic diet. Does not check FBS at home. Last Hgb A1C 7.3% on 09-06-19. Recheck in 4 months.       No follow-ups on file.     Andres Shad, PA, have reviewed all documentation for this visit. The documentation on 10/11/19 for the exam, diagnosis, procedures, and orders are all accurate and complete.    Vernie Murders, [place] [phone] (phone) [phone] (fax)  [place]

## 2019-10-11 ENCOUNTER — Other Ambulatory Visit: Payer: Self-pay

## 2019-10-11 ENCOUNTER — Ambulatory Visit: Payer: BC Managed Care – PPO | Admitting: Family Medicine

## 2019-10-11 ENCOUNTER — Encounter: Payer: Self-pay | Admitting: Family Medicine

## 2019-10-11 VITALS — BP 200/72 | HR 76 | Temp 97.5°F | Wt 179.0 lb

## 2019-10-11 DIAGNOSIS — Z1211 Encounter for screening for malignant neoplasm of colon: Secondary | ICD-10-CM | POA: Diagnosis not present

## 2019-10-11 DIAGNOSIS — E119 Type 2 diabetes mellitus without complications: Secondary | ICD-10-CM

## 2019-10-11 DIAGNOSIS — Z0000 Encounter for general adult medical examination without abnormal findings: Secondary | ICD-10-CM

## 2019-10-11 DIAGNOSIS — Z1231 Encounter for screening mammogram for malignant neoplasm of breast: Secondary | ICD-10-CM

## 2019-10-11 DIAGNOSIS — E782 Mixed hyperlipidemia: Secondary | ICD-10-CM

## 2019-10-11 DIAGNOSIS — I10 Essential (primary) hypertension: Secondary | ICD-10-CM

## 2019-10-11 DIAGNOSIS — Z6832 Body mass index (BMI) 32.0-32.9, adult: Secondary | ICD-10-CM | POA: Diagnosis not present

## 2019-10-11 NOTE — Assessment & Plan Note (Signed)
Very good controlled on the Welchol 625 mg 3 tablets BID with low fat diet. Exercising more in her garden during the spring and summer. Recheck in 4 months.

## 2019-10-11 NOTE — Assessment & Plan Note (Signed)
Tolerating Losartan 100 mg qd and BP <140/90 at home. Extra anxiety coming into the office and wearing mask. Recheck in 4 months.

## 2019-10-11 NOTE — Assessment & Plan Note (Signed)
No hypoglycemic episodes. Enjoys gardening for exercise. Continue Metformin 500 mg qd and Glipizide 5 mg qd with diabetic diet. Does not check FBS at home. Last Hgb A1C 7.3% on 09-06-19. Recheck in 4 months.

## 2019-11-16 ENCOUNTER — Other Ambulatory Visit: Payer: Self-pay | Admitting: Family Medicine

## 2019-11-16 DIAGNOSIS — E785 Hyperlipidemia, unspecified: Secondary | ICD-10-CM

## 2019-11-16 MED ORDER — COLESEVELAM HCL 625 MG PO TABS: 1875.0000 mg | ORAL_TABLET | Freq: Two times a day (BID) | ORAL | 0 refills | Status: DC

## 2019-11-16 NOTE — Telephone Encounter (Signed)
Medication Refill - Medication: welchol   Has the patient contacted their pharmacy? Yes.   (Agent: If no, request that the patient contact the pharmacy for the refill.) (Agent: If yes, when and what did the pharmacy advise?)  Preferred Pharmacy (with phone number or street name):  [facility] - [place], [place] - [address]  [address] [place] [address]  Phone: [phone] Fax: [phone]  Hours: Not open 24 hours     Agent: Please be advised that RX refills may take up to 3 business days. We ask that you follow-up with your pharmacy.

## 2019-11-16 NOTE — Telephone Encounter (Signed)
Requested Prescriptions  Pending Prescriptions Disp Refills   colesevelam (WELCHOL) 625 MG tablet 180 tablet 0    Sig: Take 3 tablets (1,875 mg total) by mouth 2 (two) times daily.     Cardiovascular:  Antilipid - Bile Acid Sequestrants Failed - 11/16/2019 11:53 AM      Failed - LDL in normal range and within 360 days    LDL Chol Calc (NIH)  Date Value Ref Range Status  09/06/2019 94 0 - 99 mg/dL Final         Passed - Total Cholesterol in normal range and within 360 days    Cholesterol, Total  Date Value Ref Range Status  09/06/2019 175 100 - 199 mg/dL Final         Passed - HDL in normal range and within 360 days    HDL  Date Value Ref Range Status  09/06/2019 57 >39 mg/dL Final         Passed - Triglycerides in normal range and within 360 days    Triglycerides  Date Value Ref Range Status  09/06/2019 140 0 - 149 mg/dL Final         Passed - Valid encounter within last 12 months    Recent Outpatient Visits          1 month ago Annual physical exam   [facility], [place], [place]   2 months ago Type 2 diabetes mellitus without complication, without long-term current use of insulin (HCC)   [facility], Jodell Cipro, [place]   1 year ago Arthritis of left knee   [facility], [place], [place]   1 year ago Acute pain of left knee   [facility], [place], [place]   1 year ago Type 2 diabetes mellitus without complication, without long-term current use of insulin [facility])   [facility], Jodell Cipro, [place]      Future Appointments            In 2 months Chrismon, Jodell Cipro, PA [facility]

## 2019-12-09 ENCOUNTER — Other Ambulatory Visit: Payer: Self-pay | Admitting: Family Medicine

## 2019-12-09 DIAGNOSIS — I10 Essential (primary) hypertension: Secondary | ICD-10-CM

## 2019-12-09 DIAGNOSIS — E119 Type 2 diabetes mellitus without complications: Secondary | ICD-10-CM

## 2019-12-27 ENCOUNTER — Other Ambulatory Visit: Payer: Self-pay | Admitting: Family Medicine

## 2019-12-27 DIAGNOSIS — E785 Hyperlipidemia, unspecified: Secondary | ICD-10-CM

## 2019-12-27 MED ORDER — COLESEVELAM HCL 625 MG PO TABS: 1875.0000 mg | ORAL_TABLET | Freq: Two times a day (BID) | ORAL | 0 refills | Status: DC

## 2019-12-27 NOTE — Telephone Encounter (Signed)
Medication: colesevelam (WELCHOL) 625 MG tablet [[id number]]   Has the patient contacted their pharmacy? YES  (Agent: If no, request that the patient contact the pharmacy for the refill.) (Agent: If yes, when and what did the pharmacy advise?)  Preferred Pharmacy (with phone number or street name): [facility] - [place], [place] - [address]  Phone:  [phone] Fax:  [phone]

## 2020-01-28 ENCOUNTER — Other Ambulatory Visit: Payer: Self-pay | Admitting: Family Medicine

## 2020-01-28 DIAGNOSIS — E785 Hyperlipidemia, unspecified: Secondary | ICD-10-CM

## 2020-01-28 MED ORDER — COLESEVELAM HCL 625 MG PO TABS: 1875.0000 mg | ORAL_TABLET | Freq: Two times a day (BID) | ORAL | 0 refills | Status: DC

## 2020-01-28 NOTE — Telephone Encounter (Signed)
Medication Refill - Medication: Generic WelChol 625  Pt's daughter wants to know if the can get a three months supply.  Has the patient contacted their pharmacy? Yes.   (Agent: If no, request that the patient contact the pharmacy for the refill.) (Agent: If yes, when and what did the pharmacy advise?)  Preferred Pharmacy (with phone number or street name): [facility]  Agent: Please be advised that RX refills may take up to 3 business days. We ask that you follow-up with your pharmacy.

## 2020-01-31 DIAGNOSIS — S00412A Abrasion of left ear, initial encounter: Secondary | ICD-10-CM | POA: Diagnosis not present

## 2020-01-31 DIAGNOSIS — H903 Sensorineural hearing loss, bilateral: Secondary | ICD-10-CM | POA: Diagnosis not present

## 2020-02-11 ENCOUNTER — Ambulatory Visit: Payer: Self-pay | Admitting: Family Medicine

## 2020-03-08 DIAGNOSIS — H903 Sensorineural hearing loss, bilateral: Secondary | ICD-10-CM | POA: Diagnosis not present

## 2020-04-25 ENCOUNTER — Other Ambulatory Visit: Payer: Self-pay | Admitting: Family Medicine

## 2020-04-25 DIAGNOSIS — E785 Hyperlipidemia, unspecified: Secondary | ICD-10-CM

## 2020-04-25 NOTE — Telephone Encounter (Signed)
Requested medications are due for refill today yes  Requested medications are on the active medication list yes  Last refill 9/13  Last visit 5/24, OV states to return in 4 months, that appt was canceled.  Future visit scheduled No, canceled  Notes to clinic Passed protocol however OV note stated back in 4 months, appt  was canceled.

## 2020-11-27 DIAGNOSIS — H2513 Age-related nuclear cataract, bilateral: Secondary | ICD-10-CM | POA: Diagnosis not present

## 2020-11-27 LAB — HM DIABETES EYE EXAM

## 2020-12-04 ENCOUNTER — Other Ambulatory Visit: Payer: Self-pay

## 2020-12-04 DIAGNOSIS — I10 Essential (primary) hypertension: Secondary | ICD-10-CM

## 2020-12-04 DIAGNOSIS — E785 Hyperlipidemia, unspecified: Secondary | ICD-10-CM

## 2020-12-04 DIAGNOSIS — E119 Type 2 diabetes mellitus without complications: Secondary | ICD-10-CM

## 2020-12-04 MED ORDER — COLESEVELAM HCL 625 MG PO TABS: 1875.0000 mg | ORAL_TABLET | Freq: Two times a day (BID) | ORAL | 0 refills | Status: DC

## 2020-12-04 MED ORDER — LOSARTAN POTASSIUM 100 MG PO TABS: 100.0000 mg | ORAL_TABLET | Freq: Every day | ORAL | 0 refills | Status: DC

## 2020-12-13 DIAGNOSIS — H2511 Age-related nuclear cataract, right eye: Secondary | ICD-10-CM | POA: Diagnosis not present

## 2020-12-14 ENCOUNTER — Other Ambulatory Visit: Payer: Self-pay

## 2020-12-14 ENCOUNTER — Encounter: Payer: Self-pay | Admitting: Family Medicine

## 2020-12-14 ENCOUNTER — Ambulatory Visit: Payer: BC Managed Care – PPO | Admitting: Family Medicine

## 2020-12-14 VITALS — BP 159/65 | HR 65 | Temp 98.4°F | Ht 64.0 in | Wt 176.2 lb

## 2020-12-14 DIAGNOSIS — E782 Mixed hyperlipidemia: Secondary | ICD-10-CM | POA: Diagnosis not present

## 2020-12-14 DIAGNOSIS — I10 Essential (primary) hypertension: Secondary | ICD-10-CM | POA: Diagnosis not present

## 2020-12-14 DIAGNOSIS — E119 Type 2 diabetes mellitus without complications: Secondary | ICD-10-CM | POA: Diagnosis not present

## 2020-12-14 DIAGNOSIS — B351 Tinea unguium: Secondary | ICD-10-CM

## 2020-12-14 MED ORDER — METFORMIN HCL 500 MG PO TABS: 500.0000 mg | ORAL_TABLET | Freq: Two times a day (BID) | ORAL | 3 refills | Status: DC

## 2020-12-14 MED ORDER — LOSARTAN POTASSIUM 100 MG PO TABS: 100.0000 mg | ORAL_TABLET | Freq: Every day | ORAL | 3 refills | Status: DC

## 2020-12-14 NOTE — Progress Notes (Signed)
Established patient visit   Patient: Tammy Stevenson   DOB: Jun 15, 1950   70 y.o. Female  MRN: [id number] Visit Date: 12/14/2020  Today's healthcare provider: Dortha Kern, PA-C   No chief complaint on file.  Subjective    HPI  Hypertension, follow-up  BP Readings from Last 3 Encounters:  10/11/19 (!) 200/72  09/06/19 (!) 178/98  06/15/18 (!) 170/80   Wt Readings from Last 3 Encounters:  10/11/19 179 lb (81.2 kg)  09/06/19 188 lb (85.3 kg)  06/15/18 201 lb 3.2 oz (91.3 kg)     She was last seen for hypertension 1 years ago.  BP at that visit was 200/72. Management since that visit includes Losartan 100 mg qd and BP <140/90 at home and to return in 4 months  She reports excellent compliance with treatment. She is not having side effects.  She is following a Low Sodium diet. She is exercising. She does not smoke.  Use of agents associated with hypertension: none.   Outside blood pressures are yes Symptoms: No chest pain No chest pressure  No palpitations No syncope  No dyspnea No orthopnea  No paroxysmal nocturnal dyspnea No lower extremity edema   Pertinent labs: Lab Results  Component Value Date   CHOL 175 09/06/2019   HDL 57 09/06/2019   LDLCALC 94 09/06/2019   TRIG 140 09/06/2019   CHOLHDL 3.4 03/20/2018   Lab Results  Component Value Date   NA 138 09/06/2019   K 4.6 09/06/2019   CREATININE 0.88 09/06/2019   GFRNONAA 68 09/06/2019   GFRAA 79 09/06/2019   GLUCOSE 134 (H) 09/06/2019     The ASCVD Risk score (Goff DC Jr., et al., 2013) failed to calculate for the following reasons:   The systolic blood pressure is missing   ---------------------------------------------------------------------------------------------------   Patient Active Problem List   Diagnosis Date Noted   BMI 32.0-32.9,adult 10/07/2019   Essential hypertension 10/16/2016   Arthritis 07/10/2015   Bilateral hearing loss 07/10/2015   HLD (hyperlipidemia) 07/10/2015    Cervical paraspinal muscle spasm 07/10/2015   Type 2 diabetes mellitus (HCC) 07/10/2015   Past Medical History:  Diagnosis Date   Diabetes [facility])    Past Surgical History:  Procedure Laterality Date   ANKLE SURGERY Right    Family History  Problem Relation Age of Onset   Heart disease Mother    Stroke Mother    Hypertension Father    Lupus Sister    Social History   Tobacco Use   Smoking status: Never   Smokeless tobacco: Current  Substance Use Topics   Alcohol use: No    Alcohol/week: 0.0 standard drinks   Drug use: No    Allergies  Allergen Reactions   Azithromycin Hives and Rash       Medications: Outpatient Medications Prior to Visit  Medication Sig   albuterol (PROAIR HFA) 108 (90 Base) MCG/ACT inhaler Inhale into the lungs.    colesevelam (WELCHOL) 625 MG tablet Take 3 tablets (1,875 mg total) by mouth 2 (two) times daily. Please schedule an office visit before anymore refills.   fluticasone (FLONASE) 50 MCG/ACT nasal spray Place 2 sprays into both nostrils daily.   Fluticasone-Salmeterol (ADVAIR DISKUS) 250-50 MCG/DOSE AEPB Inhale into the lungs.   glipiZIDE (GLUCOTROL XL) 5 MG 24 hr tablet TAKE 1 TABLET DAILY   glucose blood (FREESTYLE LITE) test strip Test blood sugar once a day before breakfast.   losartan (COZAAR) 100 MG tablet Take 1 tablet (100  mg total) by mouth daily.   metFORMIN (GLUCOPHAGE) 500 MG tablet TAKE 1 TABLET TWICE A DAY   naproxen (EC NAPROSYN) 500 MG EC tablet Take 1 tablet (500 mg total) by mouth 2 (two) times daily with a meal.   No facility-administered medications prior to visit.    Review of Systems  Constitutional: Negative.   HENT:  Positive for hearing loss.   Eyes: Negative.   Respiratory: Negative.    Cardiovascular: Negative.   Gastrointestinal: Negative.   Endocrine: Negative.   Genitourinary: Negative.   Musculoskeletal: Negative.    Last CBC Lab Results  Component Value Date   WBC 9.0 09/06/2019   HGB 13.7  09/06/2019   HCT 44.1 09/06/2019   MCV 87 09/06/2019   MCH 27.1 09/06/2019   RDW 13.9 09/06/2019   PLT 204 09/06/2019   Last metabolic panel Lab Results  Component Value Date   GLUCOSE 134 (H) 09/06/2019   NA 138 09/06/2019   K 4.6 09/06/2019   CL 101 09/06/2019   CO2 23 09/06/2019   BUN 7 (L) 09/06/2019   CREATININE 0.88 09/06/2019   GFRNONAA 68 09/06/2019   GFRAA 79 09/06/2019   CALCIUM 9.5 09/06/2019   PROT 7.1 09/06/2019   ALBUMIN 4.7 09/06/2019   LABGLOB 2.4 09/06/2019   AGRATIO 2.0 09/06/2019   BILITOT 0.7 09/06/2019   ALKPHOS 81 09/06/2019   AST 23 09/06/2019   ALT 17 09/06/2019   Last lipids Lab Results  Component Value Date   CHOL 175 09/06/2019   HDL 57 09/06/2019   LDLCALC 94 09/06/2019   TRIG 140 09/06/2019   CHOLHDL 3.4 03/20/2018   Last hemoglobin A1c Lab Results  Component Value Date   HGBA1C 7.3 (H) 09/06/2019   Last thyroid functions Lab Results  Component Value Date   TSH 2.630 09/06/2019        Objective    BP (!) 159/65   Pulse 65   Temp 98.4 F (36.9 C) (Oral)   Ht 5\' 4"  (1.626 m)   Wt 176 lb 3.2 oz (79.9 kg)   BMI 30.24 kg/m   Physical Exam Constitutional:      General: She is not in acute distress.    Appearance: She is well-developed.  HENT:     Head: Normocephalic and atraumatic.     Right Ear: Hearing normal.     Left Ear: Hearing normal.     Nose: Nose normal.  Eyes:     General: Lids are normal. No scleral icterus.       Right eye: No discharge.        Left eye: No discharge.     Conjunctiva/sclera: Conjunctivae normal.  Cardiovascular:     Rate and Rhythm: Regular rhythm.     Pulses: Normal pulses.     Heart sounds: Normal heart sounds.  Pulmonary:     Effort: Pulmonary effort is normal. No respiratory distress.  Abdominal:     General: Bowel sounds are normal.     Palpations: Abdomen is soft.  Musculoskeletal:        General: Normal range of motion.     Cervical back: Normal range of motion and neck  supple.  Skin:    Findings: No lesion or rash.  Neurological:     Mental Status: She is alert and oriented to person, place, and time.  Psychiatric:        Speech: Speech normal.        Behavior: Behavior normal.  Thought Content: Thought content normal.    Diabetic Foot Form - Detailed   Diabetic Foot Exam - detailed Diabetic Foot exam was performed with the following findings: Yes 12/14/2020 11:05 AM  Visual Foot Exam completed.: Yes  Can the patient see the bottom of their feet?: Yes Are the shoes appropriate in style and fit?: Yes Is there swelling or and abnormal foot shape?: No Is there a claw toe deformity?: No Is there elevated skin temparature?: No Is there foot or ankle muscle weakness?: No Normal Range of Motion: Yes Pulse Foot Exam completed.: Yes   Right posterior Tibialias: Present Left posterior Tibialias: Present   Right Dorsalis Pedis: Present Left Dorsalis Pedis: Present  Sensory Foot Exam Completed.: Yes Semmes-Weinstein Monofilament Test R Site 1-Great Toe: Pos L Site 1-Great Toe: Pos           No results found for any visits on 12/14/20.  Assessment & Plan     1. Type 2 diabetes mellitus without complication, without long-term current use of insulin (HCC) Questionable diet compliance. Does not check BS at home. Significant hearing deficit. Very active in her yard. No known hypoglycemic episodes. Refill medications. Has refused statins in the past. Recheck follow up labs. - HgB A1c - Lipid Panel w/o Chol/HDL Ratio - Comprehensive metabolic panel - CBC with Differential/Platelet - losartan (COZAAR) 100 MG tablet; Take 1 tablet (100 mg total) by mouth daily.  Dispense: 90 tablet; Refill: 3 - metFORMIN (GLUCOPHAGE) 500 MG tablet; Take 1 tablet (500 mg total) by mouth 2 (two) times daily.  Dispense: 90 tablet; Refill: 3  2. Essential hypertension Systolic pressure elevated. Needs refill of Losartan. Questionable compliance and still using  smokeless tobacco. Recheck labs. - Lipid Panel w/o Chol/HDL Ratio - Comprehensive metabolic panel - CBC with Differential/Platelet - TSH - losartan (COZAAR) 100 MG tablet; Take 1 tablet (100 mg total) by mouth daily.  Dispense: 90 tablet; Refill: 3  3. Mixed hyperlipidemia Recently refilled the Welchol 625 mg three tablets BID. Must work on low fat diet and recheck labs today. - Lipid Panel w/o Chol/HDL Ratio - Comprehensive metabolic panel - TSH  4. Onychomycosis Thick toenails without pain or signs of infection. Advised to have podiatry evaluation for trimming.    No follow-ups on file.      I, Patrice Moates, PA-C, have reviewed all documentation for this visit. The documentation on 12/14/20 for the exam, diagnosis, procedures, and orders are all accurate and complete.    Dortha Kern, PA-C  [facility] [phone] (phone) [phone] (fax)  [facility]

## 2020-12-14 NOTE — Patient Instructions (Signed)
Preventive Care 69 Years and Older, Female Preventive care refers to lifestyle choices and visits with your health care provider that can promote health and wellness. This includes: A yearly physical exam. This is also called an annual wellness visit. Regular dental and eye exams. Immunizations. Screening for certain conditions. Healthy lifestyle choices, such as: Eating a healthy diet. Getting regular exercise. Not using drugs or products that contain nicotine and tobacco. Limiting alcohol use. What can I expect for my preventive care visit? Physical exam Your health care provider will check your: Height and weight. These may be used to calculate your BMI (body mass index). BMI is a measurement that tells if you are at a healthy weight. Heart rate and blood pressure. Body temperature. Skin for abnormal spots. Counseling Your health care provider may ask you questions about your: Past medical problems. Family's medical history. Alcohol, tobacco, and drug use. Emotional well-being. Home life and relationship well-being. Sexual activity. Diet, exercise, and sleep habits. History of falls. Memory and ability to understand (cognition). Work and work Statistician. Pregnancy and menstrual history. Access to firearms. What immunizations do I need?  Vaccines are usually given at various ages, according to a schedule. Your health care provider will recommend vaccines for you based on your age, medicalhistory, and lifestyle or other factors, such as travel or where you work. What tests do I need? Blood tests Lipid and cholesterol levels. These may be checked every 5 years, or more often depending on your overall health. Hepatitis C test. Hepatitis B test. Screening Lung cancer screening. You may have this screening every year starting at age 44 if you have a 30-pack-year history of smoking and currently smoke or have quit within the past 15 years. Colorectal cancer screening. All  adults should have this screening starting at age 39 and continuing until age 65. Your health care provider may recommend screening at age 61 if you are at increased risk. You will have tests every 1-10 years, depending on your results and the type of screening test. Diabetes screening. This is done by checking your blood sugar (glucose) after you have not eaten for a while (fasting). You may have this done every 1-3 years. Mammogram. This may be done every 1-2 years. Talk with your health care provider about how often you should have regular mammograms. Abdominal aortic aneurysm (AAA) screening. You may need this if you are a current or former smoker. BRCA-related cancer screening. This may be done if you have a family history of breast, ovarian, tubal, or peritoneal cancers. Other tests STD (sexually transmitted disease) testing, if you are at risk. Bone density scan. This is done to screen for osteoporosis. You may have this done starting at age 54. Talk with your health care provider about your test results, treatment options,and if necessary, the need for more tests. Follow these instructions at home: Eating and drinking  Eat a diet that includes fresh fruits and vegetables, whole grains, lean protein, and low-fat dairy products. Limit your intake of foods with high amounts of sugar, saturated fats, and salt. Take vitamin and mineral supplements as recommended by your health care provider. Do not drink alcohol if your health care provider tells you not to drink. If you drink alcohol: Limit how much you have to 0-1 drink a day. Be aware of how much alcohol is in your drink. In the [place] one drink equals one 12 oz bottle of beer (355 mL), one 5 oz glass of wine (148 mL), or one 1  oz glass of hard liquor (44 mL).  Lifestyle Take daily care of your teeth and gums. Brush your teeth every morning and night with fluoride toothpaste. Floss one time each day. Stay active. Exercise for at  least 30 minutes 5 or more days each week. Do not use any products that contain nicotine or tobacco, such as cigarettes, e-cigarettes, and chewing tobacco. If you need help quitting, ask your health care provider. Do not use drugs. If you are sexually active, practice safe sex. Use a condom or other form of protection in order to prevent STIs (sexually transmitted infections). Talk with your health care provider about taking a low-dose aspirin or statin. Find healthy ways to cope with stress, such as: Meditation, yoga, or listening to music. Journaling. Talking to a trusted person. Spending time with friends and family. Safety Always wear your seat belt while driving or riding in a vehicle. Do not drive: If you have been drinking alcohol. Do not ride with someone who has been drinking. When you are tired or distracted. While texting. Wear a helmet and other protective equipment during sports activities. If you have firearms in your house, make sure you follow all gun safety procedures. What's next? Visit your health care provider once a year for an annual wellness visit. Ask your health care provider how often you should have your eyes and teeth checked. Stay up to date on all vaccines. This information is not intended to replace advice given to you by your health care provider. Make sure you discuss any questions you have with your healthcare provider. Document Revised: 04/26/2020 Document Reviewed: 04/30/2018 Elsevier Patient Education  2022 [employer].

## 2020-12-20 LAB — COMPREHENSIVE METABOLIC PANEL
ALT: 16 IU/L (ref 0–32)
AST: 22 IU/L (ref 0–40)
Albumin/Globulin Ratio: 2.2 (ref 1.2–2.2)
Albumin: 4.6 g/dL (ref 3.8–4.8)
Alkaline Phosphatase: 77 IU/L (ref 44–121)
BUN/Creatinine Ratio: 13 (ref 12–28)
BUN: 10 mg/dL (ref 8–27)
Bilirubin Total: 0.6 mg/dL (ref 0.0–1.2)
CO2: 23 mmol/L (ref 20–29)
Calcium: 9.8 mg/dL (ref 8.7–10.3)
Chloride: 102 mmol/L (ref 96–106)
Creatinine, Ser: 0.79 mg/dL (ref 0.57–1.00)
Globulin, Total: 2.1 g/dL (ref 1.5–4.5)
Glucose: 159 mg/dL — ABNORMAL HIGH (ref 65–99)
Potassium: 5 mmol/L (ref 3.5–5.2)
Sodium: 139 mmol/L (ref 134–144)
Total Protein: 6.7 g/dL (ref 6.0–8.5)
eGFR: 81 mL/min/{1.73_m2} (ref >59)

## 2020-12-20 LAB — LIPID PANEL W/O CHOL/HDL RATIO
Cholesterol, Total: 180 mg/dL (ref 100–199)
HDL: 55 mg/dL (ref >39)
LDL Chol Calc (NIH): 97 mg/dL (ref 0–99)
Triglycerides: 163 mg/dL — ABNORMAL HIGH (ref 0–149)
VLDL Cholesterol Cal: 28 mg/dL (ref 5–40)

## 2020-12-20 LAB — CBC WITH DIFFERENTIAL/PLATELET
Basophils Absolute: 0.1 10*3/uL (ref 0.0–0.2)
Basos: 1 %
EOS (ABSOLUTE): 0.1 10*3/uL (ref 0.0–0.4)
Eos: 2 %
Hematocrit: 43.2 % (ref 34.0–46.6)
Hemoglobin: 13.3 g/dL (ref 11.1–15.9)
Immature Grans (Abs): 0 10*3/uL (ref 0.0–0.1)
Immature Granulocytes: 0 %
Lymphocytes Absolute: 4.4 10*3/uL — ABNORMAL HIGH (ref 0.7–3.1)
Lymphs: 54 %
MCH: 27.5 pg (ref 26.6–33.0)
MCHC: 30.8 g/dL — ABNORMAL LOW (ref 31.5–35.7)
MCV: 89 fL (ref 79–97)
Monocytes Absolute: 0.4 10*3/uL (ref 0.1–0.9)
Monocytes: 4 %
Neutrophils Absolute: 3.2 10*3/uL (ref 1.4–7.0)
Neutrophils: 39 %
Platelets: 182 10*3/uL (ref 150–450)
RBC: 4.84 x10E6/uL (ref 3.77–5.28)
RDW: 13.7 % (ref 11.7–15.4)
WBC: 8.1 10*3/uL (ref 3.4–10.8)

## 2020-12-20 LAB — HEMOGLOBIN A1C
Est. average glucose Bld gHb Est-mCnc: 206 mg/dL
Hgb A1c MFr Bld: 8.8 % — ABNORMAL HIGH (ref 4.8–5.6)

## 2020-12-20 LAB — TSH: TSH: 2.88 u[IU]/mL (ref 0.450–4.500)

## 2021-01-11 ENCOUNTER — Encounter: Payer: Self-pay | Admitting: Ophthalmology

## 2021-01-11 ENCOUNTER — Ambulatory Visit
Admission: RE | Admit: 2021-01-11 | Discharge: 2021-01-11 | Disposition: A | Discharge status: Home / Self Care | Payer: BC Managed Care – PPO | Attending: Ophthalmology | Admitting: Ophthalmology

## 2021-01-11 ENCOUNTER — Ambulatory Visit: Payer: BC Managed Care – PPO | Admitting: Anesthesiology

## 2021-01-11 ENCOUNTER — Encounter
Admission: RE | Disposition: A | Discharge status: Home / Self Care | Payer: Self-pay | Source: Home / Self Care | Attending: Ophthalmology

## 2021-01-11 DIAGNOSIS — E1136 Type 2 diabetes mellitus with diabetic cataract: Secondary | ICD-10-CM | POA: Insufficient documentation

## 2021-01-11 DIAGNOSIS — Z7984 Long term (current) use of oral hypoglycemic drugs: Secondary | ICD-10-CM | POA: Insufficient documentation

## 2021-01-11 DIAGNOSIS — I10 Essential (primary) hypertension: Secondary | ICD-10-CM | POA: Diagnosis not present

## 2021-01-11 DIAGNOSIS — H2511 Age-related nuclear cataract, right eye: Secondary | ICD-10-CM | POA: Insufficient documentation

## 2021-01-11 DIAGNOSIS — Z79899 Other long term (current) drug therapy: Secondary | ICD-10-CM | POA: Diagnosis not present

## 2021-01-11 HISTORY — PX: CATARACT EXTRACTION W/PHACO: SHX586

## 2021-01-11 LAB — GLUCOSE, CAPILLARY: Glucose-Capillary: 173 mg/dL — ABNORMAL HIGH (ref 70–99)

## 2021-01-11 SURGERY — PHACOEMULSIFICATION, CATARACT, WITH IOL INSERTION
Anesthesia: Monitor Anesthesia Care | Site: Eye | Laterality: Right

## 2021-01-11 MED ORDER — SIGHTPATH DOSE#1 BSS IO SOLN
INTRAOCULAR | Status: DC | PRN
  Administered 2021-01-11: 82 mL via OPHTHALMIC

## 2021-01-11 MED ORDER — TETRACAINE HCL 0.5 % OP SOLN
1.0000 [drp] | OPHTHALMIC | Status: DC | PRN
  Administered 2021-01-11: 1 [drp] via OPHTHALMIC

## 2021-01-11 MED ORDER — TETRACAINE HCL 0.5 % OP SOLN
OPHTHALMIC | Status: AC
  Filled 2021-01-11: qty 4

## 2021-01-11 MED ORDER — SIGHTPATH DOSE#1 BSS IO SOLN
INTRAOCULAR | Status: DC | PRN
  Administered 2021-01-11: 2 mL

## 2021-01-11 MED ORDER — BRIMONIDINE TARTRATE-TIMOLOL 0.2-0.5 % OP SOLN
OPHTHALMIC | Status: DC | PRN
  Administered 2021-01-11: 1 [drp] via OPHTHALMIC

## 2021-01-11 MED ORDER — FENTANYL CITRATE (PF) 100 MCG/2ML IJ SOLN: 25.0000 ug | INTRAMUSCULAR | Status: DC | PRN

## 2021-01-11 MED ORDER — MOXIFLOXACIN HCL 0.5 % OP SOLN
OPHTHALMIC | Status: DC | PRN
  Administered 2021-01-11: 0.2 mL via OPHTHALMIC

## 2021-01-11 MED ORDER — MIDAZOLAM HCL 2 MG/2ML IJ SOLN
INTRAMUSCULAR | Status: AC
  Filled 2021-01-11: qty 2

## 2021-01-11 MED ORDER — MIDAZOLAM HCL 2 MG/2ML IJ SOLN
INTRAMUSCULAR | Status: DC | PRN
  Administered 2021-01-11: 1 mg via INTRAVENOUS

## 2021-01-11 MED ORDER — CYCLOPENTOLATE HCL 2 % OP SOLN
1.0000 [drp] | OPHTHALMIC | Status: AC
  Administered 2021-01-11 (×3): 1 [drp] via OPHTHALMIC

## 2021-01-11 MED ORDER — MEPERIDINE HCL 25 MG/ML IJ SOLN: 6.2500 mg | INTRAMUSCULAR | Status: DC | PRN

## 2021-01-11 MED ORDER — PHENYLEPHRINE HCL 10 % OP SOLN
1.0000 [drp] | OPHTHALMIC | Status: AC
  Administered 2021-01-11 (×3): 1 [drp] via OPHTHALMIC

## 2021-01-11 MED ORDER — FENTANYL CITRATE (PF) 100 MCG/2ML IJ SOLN
INTRAMUSCULAR | Status: DC | PRN
  Administered 2021-01-11: 25 ug via INTRAVENOUS

## 2021-01-11 MED ORDER — FENTANYL CITRATE (PF) 100 MCG/2ML IJ SOLN
INTRAMUSCULAR | Status: AC
  Filled 2021-01-11: qty 2

## 2021-01-11 MED ORDER — MOXIFLOXACIN HCL 0.5 % OP SOLN: 1.0000 [drp] | Freq: Once | OPHTHALMIC | Status: DC

## 2021-01-11 MED ORDER — ONDANSETRON HCL 4 MG/2ML IJ SOLN: 4.0000 mg | Freq: Once | INTRAMUSCULAR | Status: DC | PRN

## 2021-01-11 MED ORDER — SIGHTPATH DOSE#1 NA CHONDROIT SULF-NA HYALURON 40-17 MG/ML IO SOLN
INTRAOCULAR | Status: DC | PRN
  Administered 2021-01-11: 1 mL via INTRAOCULAR

## 2021-01-11 MED ORDER — SIGHTPATH DOSE#1 BSS IO SOLN
INTRAOCULAR | Status: DC | PRN
  Administered 2021-01-11: 15 mL via INTRAOCULAR

## 2021-01-11 MED ORDER — SODIUM CHLORIDE 0.9 % IV SOLN: INTRAVENOUS | Status: DC

## 2021-01-11 SURGICAL SUPPLY — 16 items
CANNULA ANT/CHMB 27GA (MISCELLANEOUS) ×4 IMPLANT
CATARACT SUITE MONOFOCAL ARMC (MISCELLANEOUS) ×2 IMPLANT
GLOVE SURG ENC MOIS LTX SZ8 (GLOVE) ×2 IMPLANT
GLOVE SURG ENC TEXT LTX SZ6.5 (GLOVE) ×2 IMPLANT
GLOVE SURG ENC TEXT LTX SZ8 (GLOVE) ×2 IMPLANT
GOWN STRL REUS W/ TWL LRG LVL3 (GOWN DISPOSABLE) ×2 IMPLANT
GOWN STRL REUS W/TWL LRG LVL3 (GOWN DISPOSABLE) ×4
KIT POST-OP (PACKS) ×2 IMPLANT
LENS IOL TECNIS EYHANCE 22.0 (Intraocular Lens) ×2 IMPLANT
NEEDLE FILTER BLUNT 18X 1/2SAF (NEEDLE) ×1
NEEDLE FILTER BLUNT 18X1 1/2 (NEEDLE) ×1 IMPLANT
PACK EYE AFTER SURG (MISCELLANEOUS) IMPLANT
SYR 3ML LL SCALE MARK (SYRINGE) ×2 IMPLANT
SYR TB 1ML LUER SLIP (SYRINGE) ×2 IMPLANT
WATER STERILE IRR 250ML POUR (IV SOLUTION) ×2 IMPLANT
WIPE NON LINTING 3.25X3.25 (MISCELLANEOUS) ×2 IMPLANT

## 2021-01-11 NOTE — Anesthesia Preprocedure Evaluation (Signed)
Anesthesia Evaluation    Airway Mallampati: II  TM Distance: >3 FB Neck ROM: Full    Dental  (+) Edentulous Upper, Edentulous Lower   Pulmonary    Pulmonary exam normal        Cardiovascular hypertension, Pt. on medications Normal cardiovascular exam     Neuro/Psych    GI/Hepatic   Endo/Other  diabetes, Well Controlled, Oral Hypoglycemic Agents  Renal/GU      Musculoskeletal  (+) Arthritis ,   Abdominal   Peds  Hematology   Anesthesia Other Findings   Reproductive/Obstetrics                             Anesthesia Physical Anesthesia Plan  ASA: 3  Anesthesia Plan: MAC   Post-op Pain Management:    Induction: Intravenous  PONV Risk Score and Plan: 2 and Midazolam  Airway Management Planned: Natural Airway and Nasal Cannula  Additional Equipment:   Intra-op Plan:   Post-operative Plan:   Informed Consent: I have reviewed the patients History and Physical, chart, labs and discussed the procedure including the risks, benefits and alternatives for the proposed anesthesia with the patient or authorized representative who has indicated his/her understanding and acceptance.       Plan Discussed with: CRNA, Anesthesiologist and Surgeon  Anesthesia Plan Comments:         Anesthesia Quick Evaluation

## 2021-01-11 NOTE — Anesthesia Postprocedure Evaluation (Signed)
Anesthesia Post Note  Patient: Tammy Stevenson  Procedure(s) Performed: CATARACT EXTRACTION PHACO AND INTRAOCULAR LENS PLACEMENT (IOC) RIGHT DIABETIC 22.66 01:43.4 (Right: Eye)  Patient location during evaluation: Phase II Level of consciousness: awake Pain management: pain level controlled Vital Signs Assessment: post-procedure vital signs reviewed and stable Respiratory status: spontaneous breathing Cardiovascular status: blood pressure returned to baseline and stable Postop Assessment: no apparent nausea or vomiting Anesthetic complications: no   No notable events documented.   Last Vitals:  Vitals:   01/11/21 0656 01/11/21 0801  BP: (!) 176/70 (!) 142/64  Pulse: 65 (!) 55  Resp: 16 14  Temp: (!) 36.3 C (!) 36.1 C  SpO2: 99% 96%    Last Pain:  Vitals:   01/11/21 0801  TempSrc: Temporal  PainSc: 0-No pain                 Joylyn Duggin Adline Peals

## 2021-01-11 NOTE — H&P (Signed)
[facility]   Primary Care Physician:  Shella Spearing Jodell Cipro, PA-C Ophthalmologist: Dr. Druscilla Brownie  Pre-Procedure History & Physical: HPI:  Tammy Stevenson is a 70 y.o. female here for cataract surgery.   Past Medical History:  Diagnosis Date   Diabetes [facility])     Past Surgical History:  Procedure Laterality Date   ANKLE SURGERY Right     Prior to Admission medications   Medication Sig Start Date End Date Taking? Authorizing Provider  albuterol (PROAIR HFA) 108 (90 Base) MCG/ACT inhaler Inhale 2 puffs into the lungs every 6 (six) hours as needed for wheezing or shortness of breath. 07/08/13  Yes [provider]  Cholecalciferol (VITAMIN D3) 50 MCG (2000 UT) TABS Take 2,000 Units by mouth daily.   Yes [provider]  Coenzyme Q10 200 MG TABS Take 200 mg by mouth daily.   Yes [provider]  colesevelam (WELCHOL) 625 MG tablet Take 3 tablets (1,875 mg total) by mouth 2 (two) times daily. Please schedule an office visit before anymore refills. 12/04/20  Yes Chrismon, Jodell Cipro, PA-C  glucose blood (FREESTYLE LITE) test strip Test blood sugar once a day before breakfast. 01/16/17  Yes Chrismon, Jodell Cipro, PA-C  losartan (COZAAR) 100 MG tablet Take 1 tablet (100 mg total) by mouth daily. 12/14/20  Yes Chrismon, Jodell Cipro, PA-C  metFORMIN (GLUCOPHAGE) 500 MG tablet Take 1 tablet (500 mg total) by mouth 2 (two) times daily. 12/14/20  Yes Chrismon, Jodell Cipro, PA-C    Allergies as of 11/29/2020 - Review Complete 10/11/2019  Allergen Reaction Noted   Azithromycin Hives and Rash 07/10/2015    Family History  Problem Relation Age of Onset   Heart disease Mother    Stroke Mother    Hypertension Father    Lupus Sister     Social History   Socioeconomic History   Marital status: Married    Spouse name: Not on file   Number of children: Not on file   Years of education: Not on file   Highest education level: Not on file  Occupational History   Not on file   Tobacco Use   Smoking status: Never   Smokeless tobacco: Current  Substance and Sexual Activity   Alcohol use: No    Alcohol/week: 0.0 standard drinks   Drug use: No   Sexual activity: Not on file  Other Topics Concern   Not on file  Social History Narrative   Not on file   Social Determinants of Health   Financial Resource Strain: Not on file  Food Insecurity: Not on file  Transportation Needs: Not on file  Physical Activity: Not on file  Stress: Not on file  Social Connections: Not on file  Intimate Partner Violence: Not on file    Review of Systems: See HPI, otherwise negative ROS  Physical Exam: BP (!) 176/70   Pulse 65   Temp (!) 97.3 F (36.3 C) (Tympanic)   Resp 16   Ht 5\' 4"  (1.626 m)   Wt 85.7 kg   SpO2 99%   BMI 32.44 kg/m  General:   Alert, cooperative in NAD Head:  Normocephalic and atraumatic. Respiratory:  Normal work of breathing. Cardiovascular:  RRR  Impression/Plan: is here for cataract surgery.  Risks, benefits, limitations, and alternatives regarding cataract surgery have been reviewed with the patient.  Questions have been answered.  All parties agreeable.   Payton Spark, MD  01/11/2021, 7:28 AM

## 2021-01-11 NOTE — Transfer of Care (Signed)
Immediate Anesthesia Transfer of Care Note  Patient: Tammy Stevenson  Procedure(s) Performed: CATARACT EXTRACTION PHACO AND INTRAOCULAR LENS PLACEMENT (IOC) RIGHT DIABETIC 22.66 01:43.4 (Right: Eye)  Patient Location: PACU  Anesthesia Type:MAC  Level of Consciousness: awake  Airway & Oxygen Therapy: Patient Spontanous Breathing  Post-op Assessment: Report given to RN and Post -op Vital signs reviewed and stable  Post vital signs: Reviewed and stable  Last Vitals:  Vitals Value Taken Time  BP    Temp    Pulse    Resp    SpO2      Last Pain:  Vitals:   01/11/21 0656  TempSrc: Tympanic  PainSc: 0-No pain         Complications: No notable events documented.

## 2021-01-11 NOTE — Anesthesia Postprocedure Evaluation (Signed)
Anesthesia Post Note  Patient: Tammy Stevenson  Procedure(s) Performed: CATARACT EXTRACTION PHACO AND INTRAOCULAR LENS PLACEMENT (IOC) RIGHT DIABETIC 22.66 01:43.4 (Right: Eye)  Anesthesia Type: MAC Anesthetic complications: no   No notable events documented.   Last Vitals:  Vitals:   01/11/21 0656 01/11/21 0801  BP: (!) 176/70 (!) 142/64  Pulse: 65 (!) 55  Resp: 16 14  Temp: (!) 36.3 C (!) 36.1 C  SpO2: 99% 96%    Last Pain:  Vitals:   01/11/21 0801  TempSrc: Temporal  PainSc: 0-No pain                 Jessicamarie Amiri Shanon Rosser

## 2021-01-11 NOTE — Discharge Instructions (Signed)
AMBULATORY SURGERY  ?DISCHARGE INSTRUCTIONS ? ? ?The drugs that you were given will stay in your system until tomorrow so for the next 24 hours you should not: ? ?Drive an automobile ?Make any legal decisions ?Drink any alcoholic beverage ? ? ?You may resume regular meals tomorrow.  Today it is better to start with liquids and gradually work up to solid foods. ? ?You may eat anything you prefer, but it is better to start with liquids, then soup and crackers, and gradually work up to solid foods. ? ? ?Please notify your doctor immediately if you have any unusual bleeding, trouble breathing, redness and pain at the surgery site, drainage, fever, or pain not relieved by medication. ? ? ? ?Additional Instructions: ? ? ? ?Please contact your physician with any problems or Same Day Surgery at [phone], Monday through Friday 6 am to 4 pm, or [place] [facility] [place] Main number at [phone].  ?

## 2021-01-11 NOTE — Op Note (Signed)
PREOPERATIVE DIAGNOSIS:  Nuclear sclerotic cataract of the right eye.   POSTOPERATIVE DIAGNOSIS:  Cataract   OPERATIVE PROCEDURE:ORPROCALL@   SURGEON:  Galen Manila, MD.   ANESTHESIA:  Anesthesiologist: Manfred Arch, MD CRNA: Cheral Bay, CRNA  1.      Managed anesthesia care. 2.      0.35ml of Shugarcaine was instilled in the eye following the paracentesis.   COMPLICATIONS:  None.   TECHNIQUE:   Stop and chop   DESCRIPTION OF PROCEDURE:  The patient was examined and consented in the preoperative holding area where the aforementioned topical anesthesia was applied to the right eye and then brought back to the Operating Room where the right eye was prepped and draped in the usual sterile ophthalmic fashion and a lid speculum was placed. A paracentesis was created with the side port blade and the anterior chamber was filled with viscoelastic. A near clear corneal incision was performed with the steel keratome. A continuous curvilinear capsulorrhexis was performed with a cystotome followed by the capsulorrhexis forceps. Hydrodissection and hydrodelineation were carried out with BSS on a blunt cannula. The lens was removed in a stop and chop  technique and the remaining cortical material was removed with the irrigation-aspiration handpiece. The capsular bag was inflated with viscoelastic and the Technis [id number]  lens was placed in the capsular bag without complication. The remaining viscoelastic was removed from the eye with the irrigation-aspiration handpiece. The wounds were hydrated. The anterior chamber was flushed with BSS and the eye was inflated to physiologic pressure. 0.17ml of Vigamox was placed in the anterior chamber. The wounds were found to be water tight. The eye was dressed with Combigan. The patient was given protective glasses to wear throughout the day and a shield with which to sleep tonight. The patient was also given drops with which to begin a drop regimen today and  will follow-up with me in one day. Implant Name Type Inv. Item Serial No. Manufacturer Lot No. LRB No. Used Action  LENS IOL TECNIS EYHANCE 22.0 - [id number] Intraocular Lens LENS IOL TECNIS EYHANCE 22.0 [id number] [id number]   Right 1 Implanted   Procedure(s): CATARACT EXTRACTION PHACO AND INTRAOCULAR LENS PLACEMENT (IOC) RIGHT DIABETIC 22.66 01:43.4 (Right)  Electronically signed: Galen Manila 01/11/2021 7:58 AM

## 2021-02-06 ENCOUNTER — Telehealth: Payer: Self-pay

## 2021-02-06 ENCOUNTER — Telehealth: Payer: Self-pay | Admitting: Family Medicine

## 2021-02-06 ENCOUNTER — Other Ambulatory Visit: Payer: Self-pay | Admitting: Family Medicine

## 2021-02-06 DIAGNOSIS — E119 Type 2 diabetes mellitus without complications: Secondary | ICD-10-CM

## 2021-02-06 DIAGNOSIS — R0602 Shortness of breath: Secondary | ICD-10-CM

## 2021-02-06 DIAGNOSIS — E785 Hyperlipidemia, unspecified: Secondary | ICD-10-CM

## 2021-02-06 MED ORDER — COLESEVELAM HCL 625 MG PO TABS: 1875.0000 mg | ORAL_TABLET | Freq: Two times a day (BID) | ORAL | 0 refills | Status: DC

## 2021-02-06 MED ORDER — METFORMIN HCL 1000 MG PO TABS: 1000.0000 mg | ORAL_TABLET | Freq: Every day | ORAL | 0 refills | Status: DC

## 2021-02-06 MED ORDER — METFORMIN HCL 500 MG PO TABS: 500.0000 mg | ORAL_TABLET | Freq: Every evening | ORAL | 0 refills | Status: DC

## 2021-02-06 MED ORDER — ALBUTEROL SULFATE HFA 108 (90 BASE) MCG/ACT IN AERS: 2.0000 | INHALATION_SPRAY | Freq: Four times a day (QID) | RESPIRATORY_TRACT | 0 refills | Status: DC | PRN

## 2021-02-06 NOTE — Telephone Encounter (Signed)
Spoke with patients daughter on phone who states that were never contacted by out office with lab results  from 7/28, I read of Maurine Minister Chrismon result note to daughter. She requested prescription for [facility] and Welchol through [employer]. She stated that patient did not get scheduled for followup and I arranged 3 month follow up with Tammy Stevenson.

## 2021-02-06 NOTE — Telephone Encounter (Signed)
Last active prescription I see for a inhaler was Pro-Air HFA which was last prescribed 07/08/13, last office visit was 12/14/20. Last time Advair was prescribed was 01/07/2012 and was discontinued on 01/08/21, please advise. KW

## 2021-02-06 NOTE — Telephone Encounter (Signed)
When I spoke with daughter on the phone she mentioned allergies/hay fever she states of course patient has COPD . Tammy Stevenson

## 2021-02-06 NOTE — Telephone Encounter (Signed)
Copied from CRM [id number]. Topic: General - Other >> Feb 06, 2021  1:18 PM Traci Sermon wrote: Reason for CRM: Pts daughter called in wanting PCP nurse to call them back, pt daughter states she got a call about getting her A1c but she states she has already had that done

## 2021-02-06 NOTE — Telephone Encounter (Signed)
Medication Refill - Medication:  Fluticasone-Salmeterol (ADVAIR) 250-50 MCG/DOSE AEPB albuterol (PROAIR HFA) 108 (90 Base) MCG/ACT inhaler   Has the patient contacted their pharmacy? Yes.   Pharmacy has not received scripts  Preferred Pharmacy (with phone number or street name):  [facility] - [place], [place] - [address] Phone:  [phone]  Fax:  [phone]     Has the patient been seen for an appointment in the last year OR does the patient have an upcoming appointment? Yes.    Agent: Please be advised that RX refills may take up to 3 business days. We ask that you follow-up with your pharmacy.

## 2021-02-07 ENCOUNTER — Other Ambulatory Visit: Payer: Self-pay

## 2021-02-07 DIAGNOSIS — R0602 Shortness of breath: Secondary | ICD-10-CM

## 2021-02-07 MED ORDER — ALBUTEROL SULFATE HFA 108 (90 BASE) MCG/ACT IN AERS: 2.0000 | INHALATION_SPRAY | Freq: Four times a day (QID) | RESPIRATORY_TRACT | 0 refills | Status: DC | PRN

## 2021-02-08 ENCOUNTER — Telehealth: Payer: Self-pay | Admitting: Family Medicine

## 2021-02-08 DIAGNOSIS — E119 Type 2 diabetes mellitus without complications: Secondary | ICD-10-CM

## 2021-02-08 NOTE — Telephone Encounter (Signed)
Pts daughter, calling on behalf of pt, she states that she contacted the pharmacy and that they did not receive perscription. Contacted pharmacy and they state that they did receive the prescription, but that they cant refill due to the prescription being sent through mail order two days before. Advised daughter that I would get message back to nurse to see if there was anything we could do. Please advise.      [address] - [place], [place] - [address]  [address] [place] [address]  Phone: [phone] Fax: [phone]  Hours: Not open 24 hours

## 2021-02-09 NOTE — Telephone Encounter (Signed)
Patient has received medications from [facility].

## 2021-03-14 MED ORDER — PHENYLEPHRINE HCL 10 % OP SOLN
1.0000 [drp] | OPHTHALMIC | Status: AC
  Administered 2021-03-15 (×3): 1 [drp] via OPHTHALMIC

## 2021-03-14 MED ORDER — TETRACAINE HCL 0.5 % OP SOLN: 1.0000 [drp] | OPHTHALMIC | Status: DC | PRN

## 2021-03-14 MED ORDER — CYCLOPENTOLATE HCL 2 % OP SOLN
1.0000 [drp] | OPHTHALMIC | Status: AC
Start: 2021-03-14 — End: 2021-03-14
  Administered 2021-03-15 (×2): 1 [drp] via OPHTHALMIC

## 2021-03-14 MED ORDER — SODIUM CHLORIDE 0.9 % IV SOLN: INTRAVENOUS | Status: DC

## 2021-03-14 MED ORDER — MOXIFLOXACIN HCL 0.5 % OP SOLN: 1.0000 [drp] | OPHTHALMIC | Status: DC | PRN

## 2021-03-15 ENCOUNTER — Ambulatory Visit
Admission: RE | Admit: 2021-03-15 | Discharge: 2021-03-15 | Disposition: A | Discharge status: Home / Self Care | Payer: BC Managed Care – PPO | Attending: Ophthalmology | Admitting: Ophthalmology

## 2021-03-15 ENCOUNTER — Ambulatory Visit: Payer: BC Managed Care – PPO | Admitting: Anesthesiology

## 2021-03-15 ENCOUNTER — Encounter
Admission: RE | Disposition: A | Discharge status: Home / Self Care | Payer: Self-pay | Source: Home / Self Care | Attending: Ophthalmology

## 2021-03-15 ENCOUNTER — Encounter: Payer: Self-pay | Admitting: Ophthalmology

## 2021-03-15 ENCOUNTER — Other Ambulatory Visit: Payer: Self-pay

## 2021-03-15 DIAGNOSIS — Z9841 Cataract extraction status, right eye: Secondary | ICD-10-CM | POA: Insufficient documentation

## 2021-03-15 DIAGNOSIS — Z8249 Family history of ischemic heart disease and other diseases of the circulatory system: Secondary | ICD-10-CM | POA: Insufficient documentation

## 2021-03-15 DIAGNOSIS — H2512 Age-related nuclear cataract, left eye: Secondary | ICD-10-CM | POA: Insufficient documentation

## 2021-03-15 DIAGNOSIS — Z961 Presence of intraocular lens: Secondary | ICD-10-CM | POA: Diagnosis not present

## 2021-03-15 DIAGNOSIS — Z7984 Long term (current) use of oral hypoglycemic drugs: Secondary | ICD-10-CM | POA: Diagnosis not present

## 2021-03-15 DIAGNOSIS — E1136 Type 2 diabetes mellitus with diabetic cataract: Secondary | ICD-10-CM | POA: Insufficient documentation

## 2021-03-15 DIAGNOSIS — E785 Hyperlipidemia, unspecified: Secondary | ICD-10-CM | POA: Diagnosis not present

## 2021-03-15 HISTORY — PX: CATARACT EXTRACTION W/PHACO: SHX586

## 2021-03-15 LAB — GLUCOSE, CAPILLARY: Glucose-Capillary: 170 mg/dL — ABNORMAL HIGH (ref 70–99)

## 2021-03-15 SURGERY — PHACOEMULSIFICATION, CATARACT, WITH IOL INSERTION
Anesthesia: Monitor Anesthesia Care | Site: Eye | Laterality: Left

## 2021-03-15 MED ORDER — ONDANSETRON HCL 4 MG/2ML IJ SOLN
INTRAMUSCULAR | Status: DC | PRN
  Administered 2021-03-15: 4 mg via INTRAVENOUS

## 2021-03-15 MED ORDER — PHENYLEPHRINE HCL 10 % OP SOLN
OPHTHALMIC | Status: AC
  Administered 2021-03-15: 1 [drp] via OPHTHALMIC
  Filled 2021-03-15: qty 5

## 2021-03-15 MED ORDER — FENTANYL CITRATE (PF) 100 MCG/2ML IJ SOLN
INTRAMUSCULAR | Status: AC
  Filled 2021-03-15: qty 2

## 2021-03-15 MED ORDER — SIGHTPATH DOSE#1 NA CHONDROIT SULF-NA HYALURON 40-17 MG/ML IO SOLN
INTRAOCULAR | Status: DC | PRN
  Administered 2021-03-15: 1 mL via INTRAOCULAR

## 2021-03-15 MED ORDER — MIDAZOLAM HCL 2 MG/2ML IJ SOLN
INTRAMUSCULAR | Status: DC | PRN
  Administered 2021-03-15 (×4): .5 mg via INTRAVENOUS

## 2021-03-15 MED ORDER — CYCLOPENTOLATE HCL 2 % OP SOLN
OPHTHALMIC | Status: AC
  Administered 2021-03-15: 1 [drp] via OPHTHALMIC
  Filled 2021-03-15: qty 2

## 2021-03-15 MED ORDER — SIGHTPATH DOSE#1 BSS IO SOLN
INTRAOCULAR | Status: DC | PRN
  Administered 2021-03-15: 15 mL

## 2021-03-15 MED ORDER — MOXIFLOXACIN HCL 0.5 % OP SOLN
OPHTHALMIC | Status: AC
  Filled 2021-03-15: qty 3

## 2021-03-15 MED ORDER — SIGHTPATH DOSE#1 BSS IO SOLN
INTRAOCULAR | Status: DC | PRN
  Administered 2021-03-15: 72 mL via OPHTHALMIC

## 2021-03-15 MED ORDER — MIDAZOLAM HCL 2 MG/2ML IJ SOLN
INTRAMUSCULAR | Status: AC
  Filled 2021-03-15: qty 2

## 2021-03-15 MED ORDER — MOXIFLOXACIN HCL 0.5 % OP SOLN
OPHTHALMIC | Status: DC | PRN
  Administered 2021-03-15: 0.2 mL via OPHTHALMIC

## 2021-03-15 MED ORDER — BRIMONIDINE TARTRATE-TIMOLOL 0.2-0.5 % OP SOLN
OPHTHALMIC | Status: DC | PRN
  Administered 2021-03-15: 1 [drp] via OPHTHALMIC

## 2021-03-15 MED ORDER — TETRACAINE HCL 0.5 % OP SOLN
OPHTHALMIC | Status: AC
  Administered 2021-03-15: 1 [drp] via OPHTHALMIC
  Filled 2021-03-15: qty 4

## 2021-03-15 MED ORDER — SIGHTPATH DOSE#1 BSS IO SOLN
INTRAOCULAR | Status: DC | PRN
  Administered 2021-03-15: 1 mL via INTRAMUSCULAR

## 2021-03-15 SURGICAL SUPPLY — 13 items
GLOVE SURG ENC MOIS LTX SZ8 (GLOVE) ×2 IMPLANT
GLOVE SURG ENC TEXT LTX SZ6.5 (GLOVE) ×2 IMPLANT
GLOVE SURG ENC TEXT LTX SZ8 (GLOVE) ×2 IMPLANT
GOWN STRL REUS W/ TWL LRG LVL3 (GOWN DISPOSABLE) ×2 IMPLANT
GOWN STRL REUS W/TWL LRG LVL3 (GOWN DISPOSABLE) ×4
LENS IOL TECNIS EYHANCE 22.5 (Intraocular Lens) ×2 IMPLANT
NEEDLE FILTER BLUNT 18X 1/2SAF (NEEDLE) ×1
NEEDLE FILTER BLUNT 18X1 1/2 (NEEDLE) ×1 IMPLANT
SYR 3ML LL SCALE MARK (SYRINGE) ×2 IMPLANT
SYR 5ML LL (SYRINGE) IMPLANT
SYR TB 1ML LUER SLIP (SYRINGE) ×2 IMPLANT
WATER STERILE IRR 250ML POUR (IV SOLUTION) ×2 IMPLANT
WIPE NON LINTING 3.25X3.25 (MISCELLANEOUS) IMPLANT

## 2021-03-15 NOTE — Anesthesia Postprocedure Evaluation (Signed)
Anesthesia Post Note  Patient: Barista  Procedure(s) Performed: CATARACT EXTRACTION PHACO AND INTRAOCULAR LENS PLACEMENT (IOC) LEFT DIABETIC (Left: Eye)  Patient location during evaluation: PACU Anesthesia Type: MAC Level of consciousness: awake and alert Pain management: pain level controlled Vital Signs Assessment: post-procedure vital signs reviewed and stable Respiratory status: spontaneous breathing, nonlabored ventilation and respiratory function stable Cardiovascular status: stable and blood pressure returned to baseline Postop Assessment: no apparent nausea or vomiting Anesthetic complications: no   No notable events documented.   Last Vitals:  Vitals:   03/15/21 0806 03/15/21 0815  BP: (!) 161/68 (!) 144/61  Pulse: (!) 58 (!) 59  Resp: 16 16  Temp: (!) 36.1 C   SpO2:  95%    Last Pain:  Vitals:   03/15/21 0815  TempSrc:   PainSc: 0-No pain                 Iran Ouch

## 2021-03-15 NOTE — Anesthesia Preprocedure Evaluation (Addendum)
Anesthesia Evaluation  Patient identified by MRN, date of birth, ID band Patient awake    Reviewed: Allergy & Precautions, NPO status , Patient's Chart, lab work & pertinent test results  Airway Mallampati: II  TM Distance: >3 FB Neck ROM: Full    Dental  (+) Edentulous Upper, Edentulous Lower   Pulmonary neg pulmonary ROS,    Pulmonary exam normal        Cardiovascular hypertension, Pt. on medications Normal cardiovascular exam     Neuro/Psych negative neurological ROS  negative psych ROS   GI/Hepatic negative GI ROS, Neg liver ROS,   Endo/Other  diabetes, Well Controlled, Oral Hypoglycemic Agents  Renal/GU negative Renal ROS  negative genitourinary   Musculoskeletal  (+) Arthritis ,   Abdominal Normal abdominal exam  (+)   Peds negative pediatric ROS (+)  Hematology negative hematology ROS (+)   Anesthesia Other Findings   Reproductive/Obstetrics negative OB ROS                            Anesthesia Physical  Anesthesia Plan  ASA: 3  Anesthesia Plan: MAC   Post-op Pain Management:    Induction: Intravenous  PONV Risk Score and Plan: 2 and Midazolam  Airway Management Planned: Natural Airway and Nasal Cannula  Additional Equipment:   Intra-op Plan:   Post-operative Plan:   Informed Consent: I have reviewed the patients History and Physical, chart, labs and discussed the procedure including the risks, benefits and alternatives for the proposed anesthesia with the patient or authorized representative who has indicated his/her understanding and acceptance.     Dental advisory given  Plan Discussed with: CRNA, Anesthesiologist and Surgeon  Anesthesia Plan Comments:        Anesthesia Quick Evaluation

## 2021-03-15 NOTE — Transfer of Care (Signed)
Immediate Anesthesia Transfer of Care Note  Patient: Tammy Stevenson  Procedure(s) Performed: CATARACT EXTRACTION PHACO AND INTRAOCULAR LENS PLACEMENT (IOC) LEFT DIABETIC (Left: Eye)  Patient Location: PACU  Anesthesia Type:MAC  Level of Consciousness: awake and patient cooperative  Airway & Oxygen Therapy: Patient Spontanous Breathing  Post-op Assessment: Report given to RN and Post -op Vital signs reviewed and stable  Post vital signs: Reviewed and stable  Last Vitals:  Vitals Value Taken Time  BP    Temp    Pulse    Resp    SpO2      Last Pain:  Vitals:   03/15/21 0652  TempSrc: Oral  PainSc: 0-No pain         Complications: No notable events documented.

## 2021-03-15 NOTE — H&P (Signed)
[facility]   Primary Care Physician:  Chrismon, Jodell Cipro, PA-C (Inactive) Ophthalmologist: Dr. Druscilla Brownie  Pre-Procedure History & Physical: HPI:  Tammy Stevenson is a 70 y.o. female here for cataract surgery.   Past Medical History:  Diagnosis Date   Diabetes [facility])     Past Surgical History:  Procedure Laterality Date   ANKLE SURGERY Right    CATARACT EXTRACTION W/PHACO Right 01/11/2021   Procedure: CATARACT EXTRACTION PHACO AND INTRAOCULAR LENS PLACEMENT (IOC) RIGHT DIABETIC 22.66 01:43.4;  Surgeon: Galen Manila, MD;  Location: [facility] ORS;  Service: Ophthalmology;  Laterality: Right;    Prior to Admission medications   Medication Sig Start Date End Date Taking? Authorizing Provider  albuterol (PROAIR HFA) 108 (90 Base) MCG/ACT inhaler Inhale 2 puffs into the lungs every 6 (six) hours as needed for wheezing or shortness of breath. 02/07/21  Yes Merita Norton T, FNP  Cholecalciferol (VITAMIN D3) 50 MCG (2000 UT) TABS Take 2,000 Units by mouth daily.   Yes [provider]  Coenzyme Q10 200 MG TABS Take 200 mg by mouth daily.   Yes [provider]  colesevelam (WELCHOL) 625 MG tablet Take 3 tablets (1,875 mg total) by mouth 2 (two) times daily. 02/06/21  Yes Jacky Kindle, FNP  losartan (COZAAR) 100 MG tablet Take 1 tablet (100 mg total) by mouth daily. 12/14/20  Yes Chrismon, Jodell Cipro, PA-C  metFORMIN (GLUCOPHAGE) 1000 MG tablet Take 1,000 mg by mouth daily with breakfast.   Yes [provider]  metFORMIN (GLUCOPHAGE) 500 MG tablet Take 500 mg by mouth every evening.   Yes [provider]  glucose blood (FREESTYLE LITE) test strip Test blood sugar once a day before breakfast. 01/16/17   Chrismon, Jodell Cipro, PA-C    Allergies as of 01/16/2021 - Review Complete 01/11/2021  Allergen Reaction Noted   Azithromycin Hives and Rash 07/10/2015    Family History  Problem Relation Age of Onset   Heart disease Mother    Stroke Mother    Hypertension  Father    Lupus Sister     Social History   Socioeconomic History   Marital status: Married    Spouse name: Not on file   Number of children: Not on file   Years of education: Not on file   Highest education level: Not on file  Occupational History   Not on file  Tobacco Use   Smoking status: Never   Smokeless tobacco: Current   Tobacco comments:    Pt chews tobacco  Substance and Sexual Activity   Alcohol use: No    Alcohol/week: 0.0 standard drinks   Drug use: No   Sexual activity: Not on file  Other Topics Concern   Not on file  Social History Narrative   Not on file   Social Determinants of Health   Financial Resource Strain: Not on file  Food Insecurity: Not on file  Transportation Needs: Not on file  Physical Activity: Not on file  Stress: Not on file  Social Connections: Not on file  Intimate Partner Violence: Not on file    Review of Systems: See HPI, otherwise negative ROS  Physical Exam: BP (!) 185/75   Pulse 66   Temp 98.6 F (37 C) (Oral)   Resp 18   Ht 5\' 4"  (1.626 m)   Wt 76.7 kg   SpO2 97%   BMI 29.01 kg/m  General:   Alert, cooperative in NAD Head:  Normocephalic and atraumatic. Respiratory:  Normal work  of breathing. Cardiovascular:  RRR  Impression/Plan: Tammy Stevenson is here for cataract surgery.  Risks, benefits, limitations, and alternatives regarding cataract surgery have been reviewed with the patient.  Questions have been answered.  All parties agreeable.   Galen Manila, MD  03/15/2021, 7:13 AM

## 2021-03-15 NOTE — Discharge Instructions (Signed)
AMBULATORY SURGERY  DISCHARGE INSTRUCTIONS   The drugs that you were given will stay in your system until tomorrow so for the next 24 hours you should not:  Drive an automobile Make any legal decisions Drink any alcoholic beverage   You may resume regular meals tomorrow.  Today it is better to start with liquids and gradually work up to solid foods.  You may eat anything you prefer, but it is better to start with liquids, then soup and crackers, and gradually work up to solid foods.   Please notify your doctor immediately if you have any unusual bleeding, trouble breathing, redness and pain at the surgery site, drainage, fever, or pain not relieved by medication.    Additional Instructions:  Follow eye instructions   Please contact your physician with any problems or Same Day Surgery at [phone], Monday through Friday 6 am to 4 pm, or [place] [facility] number at [phone].

## 2021-03-15 NOTE — Op Note (Signed)
PREOPERATIVE DIAGNOSIS:  Nuclear sclerotic cataract of the left eye.   POSTOPERATIVE DIAGNOSIS:  Nuclear sclerotic cataract of the left eye.   OPERATIVE PROCEDURE: Procedure(s): CATARACT EXTRACTION PHACO AND INTRAOCULAR LENS PLACEMENT (IOC) LEFT DIABETIC   SURGEON:  Galen Manila, MD.   ANESTHESIA:  Anesthesiologist: Foye Deer, MD CRNA: Mohammed Kindle, CRNA  1.      Managed anesthesia care. 2.     0.1ml of Shugarcaine was instilled following the paracentesis   COMPLICATIONS:  None.   TECHNIQUE:   Stop and chop   DESCRIPTION OF PROCEDURE:  The patient was examined and consented in the preoperative holding area where the aforementioned topical anesthesia was applied to the left eye and then brought back to the Operating Room where the left eye was prepped and draped in the usual sterile ophthalmic fashion and a lid speculum was placed. A paracentesis was created with the side port blade and the anterior chamber was filled with viscoelastic. A near clear corneal incision was performed with the steel keratome. A continuous curvilinear capsulorrhexis was performed with a cystotome followed by the capsulorrhexis forceps. Hydrodissection and hydrodelineation were carried out with BSS on a blunt cannula. The lens was removed in a stop and chop  technique and the remaining cortical material was removed with the irrigation-aspiration handpiece. The capsular bag was inflated with viscoelastic and the Technis [id number] lens was placed in the capsular bag without complication. The remaining viscoelastic was removed from the eye with the irrigation-aspiration handpiece. The wounds were hydrated. The anterior chamber was flushed with Miostat and the eye was inflated to physiologic pressure. 0.59ml Vigamox was placed in the anterior chamber. The wounds were found to be water tight. The eye was dressed with Vigamox. The patient was given protective glasses to wear throughout the day and a  shield with which to sleep tonight. The patient was also given drops with which to begin a drop regimen today and will follow-up with me in one day. Implant Name Type Inv. Item Serial No. Manufacturer Lot No. LRB No. Used Action  LENS IOL TECNIS EYHANCE 22.5 - [id number] Intraocular Lens LENS IOL TECNIS EYHANCE 22.5 [id number] JOHNSON   Left 1 Implanted    Procedure(s) with comments: CATARACT EXTRACTION PHACO AND INTRAOCULAR LENS PLACEMENT (IOC) LEFT DIABETIC (Left) - 7.17 0:50.4  Electronically signed: Galen Manila 03/15/2021 7:59 AM

## 2021-03-26 ENCOUNTER — Ambulatory Visit: Payer: BC Managed Care – PPO | Admitting: Family Medicine

## 2021-03-26 NOTE — Progress Notes (Deleted)
Established patient visit   Patient: Tammy Stevenson   DOB: [birth year]   70 y.o. Female  MRN: [id number] Visit Date: 03/26/2021  Today's healthcare provider: Gwyneth Sprout, FNP   No chief complaint on file.  Subjective    HPI  Diabetes Mellitus Type II, Follow-up  Lab Results  Component Value Date   HGBA1C 8.8 (H) 12/14/2020   HGBA1C 7.3 (H) 09/06/2019   HGBA1C 6.6 (A) 03/20/2018   Wt Readings from Last 3 Encounters:  03/15/21 169 lb (76.7 kg)  01/11/21 189 lb (85.7 kg)  12/14/20 176 lb 3.2 oz (79.9 kg)   Last seen for diabetes 3 months ago.  Management since then includes no changes. She reports {excellent/good/fair/poor:19665} compliance with treatment. She {is/is not:21021397} having side effects. {document side effects if present:1} Symptoms: {Yes/No:20286} fatigue {Yes/No:20286} foot ulcerations  {Yes/No:20286} appetite changes {Yes/No:20286} nausea  {Yes/No:20286} paresthesia of the feet  {Yes/No:20286} polydipsia  {Yes/No:20286} polyuria {Yes/No:20286} visual disturbances   {Yes/No:20286} vomiting     Home blood sugar records: {diabetes glucometry results:16657}  Episodes of hypoglycemia? {Yes/No:20286} {enter symptoms and frequency of symptoms if yes:1}   Current insulin regiment: none Most Recent Eye Exam: UTD Current exercise: yard work {Current diet habits:16563:::1}  Pertinent Labs: Lab Results  Component Value Date   CHOL 180 12/14/2020   HDL 55 12/14/2020   LDLCALC 97 12/14/2020   TRIG 163 (H) 12/14/2020   CHOLHDL 3.4 03/20/2018   Lab Results  Component Value Date   NA 139 12/14/2020   K 5.0 12/14/2020   CREATININE 0.79 12/14/2020   EGFR 81 12/14/2020   MICROALBUR 50 03/20/2018   LABMICR 42.8 09/07/2019     --------------------------------------------------------------------------------------------------- Hypertension, follow-up  BP Readings from Last 3 Encounters:  03/15/21 (!) 144/61  01/11/21 (!) 142/64  12/14/20 (!) 159/65    Wt Readings from Last 3 Encounters:  03/15/21 169 lb (76.7 kg)  01/11/21 189 lb (85.7 kg)  12/14/20 176 lb 3.2 oz (79.9 kg)     She was last seen for hypertension 3 months ago.  BP at that visit was 159/65. Management since that visit includes no changes.  She reports {excellent/good/fair/poor:19665} compliance with treatment. She {is/is not:9024} having side effects. {document side effects if present:1} She is following a {diet:21022986} diet. She {is/is not:9024} exercising. She does use smokeless tobacco.  Use of agents associated with hypertension: none.   Outside blood pressures are {***enter patient reported home BP readings, or 'not being checked':1}. Symptoms: {Yes/No:20286} chest pain {Yes/No:20286} chest pressure  {Yes/No:20286} palpitations {Yes/No:20286} syncope  {Yes/No:20286} dyspnea {Yes/No:20286} orthopnea  {Yes/No:20286} paroxysmal nocturnal dyspnea {Yes/No:20286} lower extremity edema   Pertinent labs: Lab Results  Component Value Date   CHOL 180 12/14/2020   HDL 55 12/14/2020   LDLCALC 97 12/14/2020   TRIG 163 (H) 12/14/2020   CHOLHDL 3.4 03/20/2018   Lab Results  Component Value Date   NA 139 12/14/2020   K 5.0 12/14/2020   CREATININE 0.79 12/14/2020   EGFR 81 12/14/2020   GLUCOSE 159 (H) 12/14/2020   TSH 2.880 12/14/2020     The 10-year ASCVD risk score (Arnett DK, et al., 2019) is: 24.8%   ---------------------------------------------------------------------------------------------------   {Link to patient history deactivated due to formatting error:1}  Medications: Outpatient Medications Prior to Visit  Medication Sig   albuterol (PROAIR HFA) 108 (90 Base) MCG/ACT inhaler Inhale 2 puffs into the lungs every 6 (six) hours as needed for wheezing or shortness of breath.   Cholecalciferol (VITAMIN D3) 50  MCG (2000 UT) TABS Take 2,000 Units by mouth daily.   Coenzyme Q10 200 MG TABS Take 200 mg by mouth daily.   colesevelam (WELCHOL) 625 MG  tablet Take 3 tablets (1,875 mg total) by mouth 2 (two) times daily.   glucose blood (FREESTYLE LITE) test strip Test blood sugar once a day before breakfast.   losartan (COZAAR) 100 MG tablet Take 1 tablet (100 mg total) by mouth daily.   metFORMIN (GLUCOPHAGE) 1000 MG tablet Take 1,000 mg by mouth daily with breakfast.   metFORMIN (GLUCOPHAGE) 500 MG tablet Take 500 mg by mouth every evening.   No facility-administered medications prior to visit.    Review of Systems  {Labs  Heme  Chem  Endocrine  Serology  Results Review (optional):23779}   Objective    There were no vitals taken for this visit. {Show previous vital signs (optional):23777}  Physical Exam  ***  No results found for any visits on 03/26/21.  Assessment & Plan     ***  No follow-ups on file.      {provider attestation***:1}   Gwyneth Sprout, [place] [phone] (phone) [phone] (fax)  [place]

## 2021-04-03 ENCOUNTER — Telehealth: Payer: Self-pay | Admitting: Family Medicine

## 2021-04-03 NOTE — Telephone Encounter (Signed)
Tasha with [facility] called back. Stated she called on 03/26/21 and did not hear back. Requesting to speak with someone regarding pt's diabetes, medication and therapy recommendation.  CB [phone]  Copied from CRM [id number]. Topic: General - Call Back - No Documentation >> Mar 23, 2021 11:48 AM Randol Kern wrote: Reason for CRM:   Requesting to speak to the clinic, has questions about the patient's diabetes   Tasha calling from [facility] Best contact: [phone]  [name] with [facility] called back. Stated she called on 03/26/21 and did not hear back. Requesting to speak with someone regarding pt's diabetes, medication and therapy recommendation.  CB [phone]

## 2021-04-03 NOTE — Telephone Encounter (Signed)
Spoke with [name] from [facility] who stated that she had concerns why this patient has not been started on a statin drug. Reviewing over Tammy Stevenson past records with [name] I informed her that Tammy Stevenson has had patient on Welchol since 06/2016 which patient has had good compliance, I also read off values of recent lipid panel. [name] states that she is going to fax you information to discuss with patient at next office visit, she states that she would like patient to start a statin for cardiovascular protection. KW

## 2021-04-18 ENCOUNTER — Other Ambulatory Visit: Payer: Self-pay | Admitting: Family Medicine

## 2021-04-18 DIAGNOSIS — E785 Hyperlipidemia, unspecified: Secondary | ICD-10-CM

## 2021-04-18 NOTE — Telephone Encounter (Signed)
Medication Refill - Medication: colesevelam (WELCHOL) 625 MG tablet  Has the patient contacted their pharmacy? Yes.   Said something has been faxed in   Preferred Pharmacy (with phone number or street name):  [facility] - [place], [place] - [address] Phone:  [phone]  Fax:  [phone]     Has the patient been seen for an appointment in the last year OR does the patient have an upcoming appointment? Yes.    Agent: Please be advised that RX refills may take up to 3 business days. We ask that you follow-up with your pharmacy.   Pt is completley out of medication. Can we send in a short supply to local pharmacy. Please advise  [facility] - [place] - [address] Phone:  [phone]  Fax:  [phone]

## 2021-04-19 ENCOUNTER — Other Ambulatory Visit: Payer: Self-pay | Admitting: Family Medicine

## 2021-04-20 MED ORDER — COLESEVELAM HCL 625 MG PO TABS: 1875.0000 mg | ORAL_TABLET | Freq: Two times a day (BID) | ORAL | 0 refills | Status: DC

## 2021-04-20 NOTE — Telephone Encounter (Signed)
Requested Prescriptions  Pending Prescriptions Disp Refills  . colesevelam (WELCHOL) 625 MG tablet 180 tablet 0    Sig: Take 3 tablets (1,875 mg total) by mouth 2 (two) times daily.     Cardiovascular:  Antilipid - Bile Acid Sequestrants Failed - 04/19/2021  3:33 AM      Failed - Triglycerides in normal range and within 360 days    Triglycerides  Date Value Ref Range Status  12/14/2020 163 (H) 0 - 149 mg/dL Final         Passed - Total Cholesterol in normal range and within 360 days    Cholesterol, Total  Date Value Ref Range Status  12/14/2020 180 100 - 199 mg/dL Final         Passed - LDL in normal range and within 360 days    LDL Chol Calc (NIH)  Date Value Ref Range Status  12/14/2020 97 0 - 99 mg/dL Final         Passed - HDL in normal range and within 360 days    HDL  Date Value Ref Range Status  12/14/2020 55 >39 mg/dL Final         Passed - Valid encounter within last 12 months    Recent Outpatient Visits          4 months ago Type 2 diabetes mellitus without complication, without long-term current use of insulin (HCC)   [facility] Chrismon, Jodell Cipro, PA-C   1 year ago Annual physical exam   [employer], Jodell Cipro, PA-C   1 year ago Type 2 diabetes mellitus without complication, without long-term current use of insulin (HCC)   [facility], Jodell Cipro, PA-C   2 years ago Arthritis of left knee   [employer], Jodell Cipro, PA-C   3 years ago Acute pain of left knee   [employer], Jodell Cipro, [place]

## 2021-06-27 ENCOUNTER — Telehealth: Payer: Self-pay | Admitting: Family Medicine

## 2021-06-27 DIAGNOSIS — E785 Hyperlipidemia, unspecified: Secondary | ICD-10-CM

## 2021-06-27 MED ORDER — COLESEVELAM HCL 625 MG PO TABS: 1875.0000 mg | ORAL_TABLET | Freq: Two times a day (BID) | ORAL | 0 refills | Status: DC

## 2021-06-27 NOTE — Telephone Encounter (Signed)
[facility] faxed refill request for the following medications:  colesevelam (WELCHOL) 625 MG tablet   Please advise.

## 2021-10-17 DIAGNOSIS — Z961 Presence of intraocular lens: Secondary | ICD-10-CM | POA: Diagnosis not present

## 2021-10-17 LAB — HM DIABETES EYE EXAM

## 2021-11-14 NOTE — Telephone Encounter (Signed)
Tasha from [facility] calling back regarding patient starting on statin for . Please call back

## 2021-11-14 NOTE — Telephone Encounter (Signed)
Patient is not on a statin, has not been seen in 7 months. This is the second message about the same thing with another patient and no documentation or mention of statin.

## 2021-11-26 ENCOUNTER — Telehealth: Payer: Self-pay | Admitting: Family Medicine

## 2021-11-26 NOTE — Telephone Encounter (Signed)
FYI-Spoke with Tasha from [employer]. [name] was asking if Statin has been address with patient for protection due to being diabetic. Informed that patient has not been seen for over 6 months.  Called patient and left message to call the office to schedule appointment.

## 2021-11-26 NOTE — Telephone Encounter (Signed)
Tammy Stevenson with [facility] calling about a clinical question for the patients diabetes.  CB#  [phone]

## 2021-11-28 DIAGNOSIS — H903 Sensorineural hearing loss, bilateral: Secondary | ICD-10-CM | POA: Diagnosis not present

## 2021-12-07 ENCOUNTER — Other Ambulatory Visit: Payer: Self-pay

## 2021-12-07 ENCOUNTER — Telehealth: Payer: Self-pay | Admitting: Family Medicine

## 2021-12-07 DIAGNOSIS — E119 Type 2 diabetes mellitus without complications: Secondary | ICD-10-CM

## 2021-12-07 DIAGNOSIS — I10 Essential (primary) hypertension: Secondary | ICD-10-CM

## 2021-12-07 MED ORDER — LOSARTAN POTASSIUM 100 MG PO TABS: 100.0000 mg | ORAL_TABLET | Freq: Every day | ORAL | 0 refills | Status: DC

## 2021-12-07 NOTE — Telephone Encounter (Signed)
[facility] faxed refill request for the following medications:  losartan (COZAAR) 100 MG tablet   Please advise.

## 2022-01-23 ENCOUNTER — Telehealth: Payer: Self-pay

## 2022-01-23 NOTE — Patient Outreach (Signed)
  Care Coordination   01/23/2022 Name: Tammy Stevenson MRN: [id number] DOB: June 21, 1950   Care Coordination Outreach Attempts:  An unsuccessful telephone outreach was attempted today to offer the patient information about available care coordination services as a benefit of their health plan.   Follow Up Plan:  Additional outreach attempts will be made to offer the patient care coordination information and services.   Encounter Outcome:  No Answer  Care Coordination Interventions Activated:  No   Care Coordination Interventions:  No, not indicated    Alto Denver RN, MSN, CCM Community Care Coordinator [facility]  [facility] Mobile: [phone]

## 2022-04-15 ENCOUNTER — Other Ambulatory Visit: Payer: Self-pay | Admitting: Family Medicine

## 2022-05-27 ENCOUNTER — Ambulatory Visit: Payer: BC Managed Care – PPO | Admitting: Family Medicine

## 2022-05-27 ENCOUNTER — Encounter: Payer: Self-pay | Admitting: Family Medicine

## 2022-05-27 VITALS — BP 203/90 | HR 92 | Temp 97.8°F | Resp 16 | Wt 165.0 lb

## 2022-05-27 DIAGNOSIS — Z1231 Encounter for screening mammogram for malignant neoplasm of breast: Secondary | ICD-10-CM

## 2022-05-27 DIAGNOSIS — E1165 Type 2 diabetes mellitus with hyperglycemia: Secondary | ICD-10-CM | POA: Diagnosis not present

## 2022-05-27 DIAGNOSIS — E1169 Type 2 diabetes mellitus with other specified complication: Secondary | ICD-10-CM | POA: Diagnosis not present

## 2022-05-27 DIAGNOSIS — I152 Hypertension secondary to endocrine disorders: Secondary | ICD-10-CM

## 2022-05-27 DIAGNOSIS — E785 Hyperlipidemia, unspecified: Secondary | ICD-10-CM

## 2022-05-27 DIAGNOSIS — E1159 Type 2 diabetes mellitus with other circulatory complications: Secondary | ICD-10-CM

## 2022-05-27 DIAGNOSIS — H9193 Unspecified hearing loss, bilateral: Secondary | ICD-10-CM

## 2022-05-27 DIAGNOSIS — J449 Chronic obstructive pulmonary disease, unspecified: Secondary | ICD-10-CM | POA: Insufficient documentation

## 2022-05-27 DIAGNOSIS — Z1211 Encounter for screening for malignant neoplasm of colon: Secondary | ICD-10-CM | POA: Insufficient documentation

## 2022-05-27 DIAGNOSIS — B351 Tinea unguium: Secondary | ICD-10-CM | POA: Insufficient documentation

## 2022-05-27 DIAGNOSIS — J441 Chronic obstructive pulmonary disease with (acute) exacerbation: Secondary | ICD-10-CM | POA: Insufficient documentation

## 2022-05-27 DIAGNOSIS — F17200 Nicotine dependence, unspecified, uncomplicated: Secondary | ICD-10-CM

## 2022-05-27 DIAGNOSIS — Z13820 Encounter for screening for osteoporosis: Secondary | ICD-10-CM | POA: Insufficient documentation

## 2022-05-27 MED ORDER — PREDNISONE 50 MG PO TABS: 50.0000 mg | ORAL_TABLET | Freq: Every day | ORAL | 0 refills | Status: DC

## 2022-05-27 MED ORDER — ALBUTEROL SULFATE HFA 108 (90 BASE) MCG/ACT IN AERS: 2.0000 | INHALATION_SPRAY | Freq: Four times a day (QID) | RESPIRATORY_TRACT | 0 refills | Status: DC | PRN

## 2022-05-27 MED ORDER — AMOXICILLIN 875 MG PO TABS: 875.0000 mg | ORAL_TABLET | Freq: Two times a day (BID) | ORAL | 0 refills | Status: AC

## 2022-05-27 NOTE — Assessment & Plan Note (Signed)
Acute on chronic, uncontrolled Goal <130/<80 Previously on Losartan 100 mg; however, has been "out of medication" for months Last seen in OV 01/2021 and did not receive any mediation since 01/2022; may have been without for 3+ months Recommend CMP prior to restart

## 2022-05-27 NOTE — Assessment & Plan Note (Signed)
Chronic, stable Referral placed to podiatry for assistance given uncontrolled T2DM

## 2022-05-27 NOTE — Assessment & Plan Note (Signed)
Last A1c >8% Repeat A1c today Continue to recommend balanced, lower carb meals. Smaller meal size, adding snacks. Choosing water as drink of choice and increasing purposeful exercise. Due for DM foot exam with hx of nail fungus- referral placed to podiatry Previously on Metformin 1000 mg qAM, 500 mg qPM

## 2022-05-27 NOTE — Progress Notes (Signed)
I,Joseline E Rosas,acting as a scribe for Jacky Kindle, FNP.,have documented all relevant documentation on the behalf of Jacky Kindle, FNP,as directed by  Jacky Kindle, FNP while in the presence of Jacky Kindle, FNP.  Established patient visit  Patient: Tammy Stevenson   DOB: 02/10/1951   72 y.o. Female  MRN: [id number] Visit Date: 05/27/2022  Today's healthcare provider: Jacky Kindle, FNP  Patient presents for new patient visit to establish care.  Introduced to Publishing rights manager role and practice setting.  All questions answered.  Discussed provider/patient relationship and expectations.   Chief Complaint  Patient presents with   Cough   Subjective    Cough This is a new problem. The current episode started 1 to 4 weeks ago. The problem has been gradually worsening. The problem occurs constantly. The cough is Productive of sputum. Associated symptoms include rhinorrhea and wheezing. Pertinent negatives include no fever, headaches, nasal congestion or postnasal drip. She has tried OTC cough suppressant (Mucinex-DM and Robitussin DM fro a week) for the symptoms. The treatment provided no relief.    Patient reports that she is out of Blood Pressure and Cholesterol medicines.  Medications: Outpatient Medications Prior to Visit  Medication Sig   [DISCONTINUED] albuterol (PROAIR HFA) 108 (90 Base) MCG/ACT inhaler Inhale 2 puffs into the lungs every 6 (six) hours as needed for wheezing or shortness of breath.   [DISCONTINUED] Cholecalciferol (VITAMIN D3) 50 MCG (2000 UT) TABS Take 2,000 Units by mouth daily.   [DISCONTINUED] Coenzyme Q10 200 MG TABS Take 200 mg by mouth daily.   [DISCONTINUED] colesevelam (WELCHOL) 625 MG tablet Take 3 tablets (1,875 mg total) by mouth 2 (two) times daily.   [DISCONTINUED] glucose blood (FREESTYLE LITE) test strip Test blood sugar once a day before breakfast.   [DISCONTINUED] losartan (COZAAR) 100 MG tablet Take 1 tablet (100 mg total) by mouth daily.    [DISCONTINUED] metFORMIN (GLUCOPHAGE) 1000 MG tablet TAKE 1 TABLET DAILY WITH BREAKFAST   [DISCONTINUED] metFORMIN (GLUCOPHAGE) 500 MG tablet Take 500 mg by mouth every evening.   No facility-administered medications prior to visit.    Review of Systems  Constitutional:  Negative for fever.  HENT:  Positive for rhinorrhea. Negative for postnasal drip.   Respiratory:  Positive for cough and wheezing.   Neurological:  Negative for headaches.     Objective    BP (!) 203/90 (BP Location: Left Arm, Patient Position: Sitting, Cuff Size: Normal)   Pulse 92   Temp 97.8 F (36.6 C) (Oral)   Resp 16   Wt 165 lb (74.8 kg)   SpO2 94%   BMI 28.32 kg/m   Physical Exam Vitals and nursing note reviewed.  Constitutional:      General: She is not in acute distress.    Appearance: Normal appearance. She is overweight. She is not ill-appearing, toxic-appearing or diaphoretic.  HENT:     Head: Normocephalic and atraumatic.     Right Ear: Tympanic membrane, ear canal and external ear normal.     Left Ear: Tympanic membrane, ear canal and external ear normal.     Nose: Congestion and rhinorrhea present.     Mouth/Throat:     Mouth: Mucous membranes are moist.     Pharynx: Oropharynx is clear. No oropharyngeal exudate or posterior oropharyngeal erythema.  Eyes:     Conjunctiva/sclera: Conjunctivae normal.  Cardiovascular:     Rate and Rhythm: Normal rate and regular rhythm.     Pulses: Normal pulses.  Heart sounds: Normal heart sounds. No murmur heard.    No friction rub. No gallop.  Pulmonary:     Effort: Pulmonary effort is normal. No respiratory distress.     Breath sounds: Decreased air movement present. No stridor. Decreased breath sounds and wheezing present. No rhonchi or rales.     Comments: Consistent with COPD Chest:     Chest wall: No tenderness.  Musculoskeletal:        General: No swelling, tenderness, deformity or signs of injury. Normal range of motion.     Cervical  back: Normal range of motion and neck supple. No tenderness.     Right lower leg: 1+ Edema present.     Left lower leg: 1+ Edema present.  Skin:    General: Skin is warm and dry.     Capillary Refill: Capillary refill takes less than 2 seconds.     Coloration: Skin is not jaundiced or pale.     Findings: No bruising, erythema, lesion or rash.  Neurological:     General: No focal deficit present.     Mental Status: She is alert and oriented to person, place, and time. Mental status is at baseline.     Cranial Nerves: No cranial nerve deficit.     Sensory: No sensory deficit.     Motor: No weakness.     Coordination: Coordination normal.  Psychiatric:        Mood and Affect: Mood normal.        Behavior: Behavior normal.        Thought Content: Thought content normal.        Judgment: Judgment normal.     No results found for any visits on 05/27/22.  Assessment & Plan     Problem List Items Addressed This Visit       Cardiovascular and Mediastinum   Hypertension associated with diabetes (HCC)    Acute on chronic, uncontrolled Goal <130/<80 Previously on Losartan 100 mg; however, has been "out of medication" for months Last seen in OV 01/2021 and did not receive any mediation since 01/2022; may have been without for 3+ months Recommend CMP prior to restart       Relevant Orders   Comprehensive Metabolic Panel (CMET)   CBC with Differential/Platelet   Hemoglobin A1c   Urine Microalbumin w/creat. ratio     Respiratory   Acute exacerbation of chronic obstructive pulmonary disease (COPD) (HCC) - Primary    Acute, stable Slight improvement with OTC- Mucinex and Robitussin Does not use daily controller inhalers for COPD; currently out of PRN albuterol for rescue Does not wish to start on controller inhaler at this time; wishes to refill albuterol only       Relevant Medications   albuterol (PROAIR HFA) 108 (90 Base) MCG/ACT inhaler   predniSONE (DELTASONE) 50 MG tablet    amoxicillin (AMOXIL) 875 MG tablet   Other Relevant Orders   Comprehensive Metabolic Panel (CMET)   CBC with Differential/Platelet   Ambulatory Referral Lung Cancer Screening [facility]     Endocrine   Hyperlipidemia associated with type 2 diabetes mellitus (HCC)    Chronic, uncontrolled 11/2020 Due for LP; repeat today LDL goal <70 Previously on Welchol 1875 mg (625 mg tabs)      Relevant Orders   Lipid panel   Hemoglobin A1c   Urine Microalbumin w/creat. ratio   Uncontrolled type 2 diabetes mellitus with hyperglycemia (HCC)    Last A1c >8% Repeat A1c today Continue to recommend  balanced, lower carb meals. Smaller meal size, adding snacks. Choosing water as drink of choice and increasing purposeful exercise. Due for DM foot exam with hx of nail fungus- referral placed to podiatry Previously on Metformin 1000 mg qAM, 500 mg qPM      Relevant Orders   Ambulatory referral to Podiatry     Nervous and Auditory   Bilateral hearing loss    Chronic, stable Use of bilateral hearing aids         Musculoskeletal and Integument   Onychomycosis    Chronic, stable Referral placed to podiatry for assistance given uncontrolled T2DM      Relevant Orders   Ambulatory referral to Podiatry     Other   Screening for colon cancer   Relevant Orders   Ambulatory referral to Gastroenterology   Screening for osteoporosis   Relevant Orders   DG Bone Density   Screening mammogram for breast cancer   Relevant Orders   MM 3D SCREEN BREAST BILATERAL   Tobacco use disorder    Chronic, stable Low Dose CT scan recommended given current tobacco use with acute exacerbation for annual screening      Relevant Orders   Ambulatory Referral Lung Cancer Screening [facility]   Return in about 4 weeks (around 06/24/2022), or if symptoms worsen or fail to improve, for chonic disease management.     Vonna Kotyk, FNP, have reviewed all documentation for this visit. The  documentation on 05/27/22 for the exam, diagnosis, procedures, and orders are all accurate and complete.  Gwyneth Sprout, [place] [phone] (phone) [phone] (fax)  [place]

## 2022-05-27 NOTE — Assessment & Plan Note (Signed)
Chronic, uncontrolled 11/2020 Due for LP; repeat today LDL goal <70 Previously on Welchol 1875 mg (625 mg tabs)

## 2022-05-27 NOTE — Assessment & Plan Note (Signed)
Chronic, stable Use of bilateral hearing aids

## 2022-05-27 NOTE — Patient Instructions (Signed)
The CDC recommends two doses of Shingrix (the new shingles vaccine) separated by 2 to 6 months for adults age 71 years and older. I recommend checking with your insurance plan regarding coverage for this vaccine.    Please call and schedule your mammogram and DEXA (bone screening)  [facility]  [place], [place],  [place]  [address] Get Driving Directions Main: [phone]  Sunday:Closed Monday:7:20 AM - 5:00 PM Tuesday:7:20 AM - 5:00 PM Wednesday:7:20 AM - 5:00 PM Thursday:7:20 AM - 5:00 PM Friday:7:20 AM - 4:30 PM Saturday:Closed

## 2022-05-27 NOTE — Assessment & Plan Note (Signed)
Chronic, stable Low Dose CT scan recommended given current tobacco use with acute exacerbation for annual screening

## 2022-05-27 NOTE — Assessment & Plan Note (Signed)
Acute, stable Slight improvement with OTC- Mucinex and Robitussin Does not use daily controller inhalers for COPD; currently out of PRN albuterol for rescue Does not wish to start on controller inhaler at this time; wishes to refill albuterol only

## 2022-05-28 ENCOUNTER — Other Ambulatory Visit: Payer: Self-pay | Admitting: Family Medicine

## 2022-05-28 LAB — COMPREHENSIVE METABOLIC PANEL
ALT: 9 IU/L (ref 0–32)
AST: 15 IU/L (ref 0–40)
Albumin/Globulin Ratio: 1.6 (ref 1.2–2.2)
Albumin: 4.2 g/dL (ref 3.9–4.9)
Alkaline Phosphatase: 85 IU/L (ref 44–121)
BUN/Creatinine Ratio: 10 — ABNORMAL LOW (ref 12–28)
BUN: 8 mg/dL (ref 8–27)
Bilirubin Total: 0.6 mg/dL (ref 0.0–1.2)
CO2: 25 mmol/L (ref 20–29)
Calcium: 9.1 mg/dL (ref 8.7–10.3)
Chloride: 100 mmol/L (ref 96–106)
Creatinine, Ser: 0.83 mg/dL (ref 0.57–1.00)
Globulin, Total: 2.7 g/dL (ref 1.5–4.5)
Glucose: 192 mg/dL — ABNORMAL HIGH (ref 70–99)
Potassium: 4.1 mmol/L (ref 3.5–5.2)
Sodium: 139 mmol/L (ref 134–144)
Total Protein: 6.9 g/dL (ref 6.0–8.5)
eGFR: 76 mL/min/{1.73_m2} (ref >59)

## 2022-05-28 LAB — LIPID PANEL
Chol/HDL Ratio: 4.2 ratio (ref 0.0–4.4)
Cholesterol, Total: 163 mg/dL (ref 100–199)
HDL: 39 mg/dL — ABNORMAL LOW (ref >39)
LDL Chol Calc (NIH): 103 mg/dL — ABNORMAL HIGH (ref 0–99)
Triglycerides: 115 mg/dL (ref 0–149)
VLDL Cholesterol Cal: 21 mg/dL (ref 5–40)

## 2022-05-28 LAB — MICROALBUMIN / CREATININE URINE RATIO
Creatinine, Urine: 127.8 mg/dL
Microalb/Creat Ratio: 20 mg/g creat (ref 0–29)
Microalbumin, Urine: 25.4 ug/mL

## 2022-05-28 LAB — CBC WITH DIFFERENTIAL/PLATELET
Basophils Absolute: 0.1 10*3/uL (ref 0.0–0.2)
Basos: 1 %
EOS (ABSOLUTE): 0.1 10*3/uL (ref 0.0–0.4)
Eos: 1 %
Hematocrit: 38.6 % (ref 34.0–46.6)
Hemoglobin: 12.9 g/dL (ref 11.1–15.9)
Immature Grans (Abs): 0 10*3/uL (ref 0.0–0.1)
Immature Granulocytes: 0 %
Lymphocytes Absolute: 3.8 10*3/uL — ABNORMAL HIGH (ref 0.7–3.1)
Lymphs: 42 %
MCH: 27.7 pg (ref 26.6–33.0)
MCHC: 33.4 g/dL (ref 31.5–35.7)
MCV: 83 fL (ref 79–97)
Monocytes Absolute: 0.4 10*3/uL (ref 0.1–0.9)
Monocytes: 5 %
Neutrophils Absolute: 4.7 10*3/uL (ref 1.4–7.0)
Neutrophils: 51 %
Platelets: 231 10*3/uL (ref 150–450)
RBC: 4.65 x10E6/uL (ref 3.77–5.28)
RDW: 12.5 % (ref 11.7–15.4)
WBC: 9.1 10*3/uL (ref 3.4–10.8)

## 2022-05-28 LAB — HEMOGLOBIN A1C
Est. average glucose Bld gHb Est-mCnc: 186 mg/dL
Hgb A1c MFr Bld: 8.1 % — ABNORMAL HIGH (ref 4.8–5.6)

## 2022-05-28 MED ORDER — VALSARTAN 160 MG PO TABS: 160.0000 mg | ORAL_TABLET | Freq: Every day | ORAL | 1 refills | Status: DC

## 2022-05-28 MED ORDER — METFORMIN HCL ER 500 MG PO TB24: 1000.0000 mg | ORAL_TABLET | Freq: Two times a day (BID) | ORAL | 1 refills | Status: DC

## 2022-05-28 MED ORDER — ROSUVASTATIN CALCIUM 40 MG PO TABS: 40.0000 mg | ORAL_TABLET | Freq: Every day | ORAL | 1 refills | Status: DC

## 2022-05-28 MED ORDER — GLIPIZIDE 10 MG PO TABS: 10.0000 mg | ORAL_TABLET | Freq: Every day | ORAL | 1 refills | Status: DC

## 2022-05-28 NOTE — Progress Notes (Signed)
Labs relatively stable - A1c remains uncontrolled at >8%, blood chemistry shows elevated glucose as well.  LDL/bad cholesterol is elevated. Goal <70 LDL. Urine pending.

## 2022-05-29 NOTE — Progress Notes (Signed)
Normal urine micro ratio; repeat in 1 year.

## 2022-05-30 ENCOUNTER — Telehealth: Payer: Self-pay | Admitting: *Deleted

## 2022-05-30 NOTE — Telephone Encounter (Signed)
Patient/daughter calling for lab results:   Patient's daughter is calling: Pershing Cox) Labs results reviewed- she reports [facility]- please send 90 day supply.  Also patient had been on Welchol for cholesterol- she had not taken in over 1 month( she was out)- could that cause lipid panel to be inaccurate- questions why the change in medication? Please call her back [phone] regarding changes in medications.

## 2022-06-03 ENCOUNTER — Other Ambulatory Visit: Payer: Self-pay | Admitting: Family Medicine

## 2022-06-03 MED ORDER — COLESEVELAM HCL 625 MG PO TABS: 1875.0000 mg | ORAL_TABLET | Freq: Two times a day (BID) | ORAL | 3 refills | Status: DC

## 2022-06-05 NOTE — Telephone Encounter (Signed)
Daughter checking on the status of message below and would like a follow up call, please call [phone]

## 2022-06-21 NOTE — Progress Notes (Deleted)
Established patient visit   Patient: Tammy Stevenson   DOB: [birth year]   72 y.o. Female  MRN: [id number] Visit Date: 06/25/2022  Today's healthcare provider: Gwyneth Sprout, FNP   No chief complaint on file.  Subjective    HPI  Diabetes Mellitus Type II, follow-up  Lab Results  Component Value Date   HGBA1C 8.1 (H) 05/27/2022   HGBA1C 8.8 (H) 12/14/2020   HGBA1C 7.3 (H) 09/06/2019   Last seen for diabetes {1-12:18279} {days/wks/mos/yrs:310907} ago.  Management since then includes {management:2::"continuing the same treatment."} She reports {excellent/good/fair/poor:19665} compliance with treatment. She {is/is not:21021397} having side effects. {document side effects if present:1}  Home blood sugar records: {diabetes glucometry results:16657}  Episodes of hypoglycemia? {Yes/No:20286} {enter details if yes:1}   Current insulin regiment: {***Type 'None' if not taking insulin                                                otherwise enter complete                                                 details of insulin regiment:1} Most Recent Eye Exam: ***  --------------------------------------------------------------------------------------------------- Hypertension, follow-up  BP Readings from Last 3 Encounters:  05/27/22 (!) 203/90  03/15/21 (!) 144/61  01/11/21 (!) 142/64   Wt Readings from Last 3 Encounters:  05/27/22 165 lb (74.8 kg)  03/15/21 169 lb (76.7 kg)  01/11/21 189 lb (85.7 kg)     She was last seen for hypertension {NUMBERS 1-12:18279} {days/wks/mos/yrs:310907} ago.  BP at that visit was ***. Management since that visit includes ***. She reports {excellent/good/fair/poor:19665} compliance with treatment. She {is/is not:9024} having side effects. {document side effects if present:1} She {is/is not:9024} exercising. She {is/is not:9024} adherent to low salt diet.   Outside blood pressures are {enter patient reported home BP, or 'not being  checked':1}.  She {does/does not:200015} smoke.  Use of agents associated with hypertension: {bp agents assoc with hypertension:511::"none"}.   --------------------------------------------------------------------------------------------------- Lipid/Cholesterol, follow-up  Last Lipid Panel: Lab Results  Component Value Date   CHOL 163 05/27/2022   LDLCALC 103 (H) 05/27/2022   HDL 39 (L) 05/27/2022   TRIG 115 05/27/2022    She was last seen for this {1-12:18279} {days/wks/mos/yrs:310907} ago.  Management since that visit includes ***.  She reports {excellent/good/fair/poor:19665} compliance with treatment. She {is/is not:9024} having side effects. {document side effects if present:1}  Symptoms: {Yes/No:20286} appetite changes {Yes/No:20286} foot ulcerations  {Yes/No:20286} chest pain {Yes/No:20286} chest pressure/discomfort  {Yes/No:20286} dyspnea {Yes/No:20286} orthopnea  {Yes/No:20286} fatigue {Yes/No:20286} lower extremity edema  {Yes/No:20286} palpitations {Yes/No:20286} paroxysmal nocturnal dyspnea  {Yes/No:20286} nausea {Yes/No:20286} numbness or tingling of extremity  {Yes/No:20286} polydipsia {Yes/No:20286} polyuria  {Yes/No:20286} speech difficulty {Yes/No:20286} syncope   She is following a {diet:21022986} diet. Current exercise: {exercise 99991111  Last metabolic panel Lab Results  Component Value Date   GLUCOSE 192 (H) 05/27/2022   NA 139 05/27/2022   K 4.1 05/27/2022   BUN 8 05/27/2022   CREATININE 0.83 05/27/2022   EGFR 76 05/27/2022   GFRNONAA 68 09/06/2019   CALCIUM 9.1 05/27/2022   AST 15 05/27/2022   ALT 9 05/27/2022   The ASCVD Risk score (Arnett DK, et al., 2019) failed  to calculate for the following reasons:   The valid systolic blood pressure range is 90 to 200 mmHg  ---------------------------------------------------------------------------------------------------   Medications: Outpatient Medications Prior to Visit  Medication Sig    albuterol (PROAIR HFA) 108 (90 Base) MCG/ACT inhaler Inhale 2 puffs into the lungs every 6 (six) hours as needed for wheezing or shortness of breath.   colesevelam (WELCHOL) 625 MG tablet Take 3 tablets (1,875 mg total) by mouth 2 (two) times daily with a meal.   glipiZIDE (GLUCOTROL) 10 MG tablet Take 1 tablet (10 mg total) by mouth daily before breakfast.   metFORMIN (GLUCOPHAGE-XR) 500 MG 24 hr tablet Take 2 tablets (1,000 mg total) by mouth 2 (two) times daily with a meal.   predniSONE (DELTASONE) 50 MG tablet Take 1 tablet (50 mg total) by mouth daily with breakfast.   rosuvastatin (CRESTOR) 40 MG tablet Take 1 tablet (40 mg total) by mouth daily.   valsartan (DIOVAN) 160 MG tablet Take 1 tablet (160 mg total) by mouth daily.   No facility-administered medications prior to visit.    Review of Systems  {Labs  Heme  Chem  Endocrine  Serology  Results Review (optional):23779}   Objective    There were no vitals taken for this visit. {Show previous vital signs (optional):23777}  Physical Exam  ***  No results found for any visits on 06/25/22.  Assessment & Plan     ***  No follow-ups on file.      {provider attestation***:1}   Gwyneth Sprout, [place] [phone] (phone) [phone] (fax)  [place]

## 2022-06-24 ENCOUNTER — Telehealth: Payer: Self-pay | Admitting: Family Medicine

## 2022-06-24 NOTE — Telephone Encounter (Signed)
Pts daughter Ms. Tammy Stevenson is calling to ask if the valsartan (DIOVAN) 160 MG tablet [[id number]]  blood pressure medication that the patient is on can be d/c because the pt is not able to tolerate the medication, causing diarrhea and kept feeling sick on the stomach. Pt stopped taking the medication on Friday or Saturday. And does not have diarrhea.  Requesting to return to the BP medication that Dr. Jeananne Rama previously had her on. Pt does not know the name.  Please advise [phone]

## 2022-06-25 ENCOUNTER — Ambulatory Visit: Payer: BC Managed Care – PPO | Admitting: Family Medicine

## 2022-10-04 NOTE — Progress Notes (Signed)
[facility] Quality Team Note  Name: Tammy Stevenson Date of Birth: 1950/07/20 MRN: [id number] Date: 10/04/2022  [facility] Quality Team has reviewed this patient's chart, please see recommendations below:  [facility] Quality Other; (PATIENT DUE FOR BREAST CANCER SCREENING AND COLON CANCER SCREENING. ATTEMPTED OUTREACH UNSUCCESSFUL BY [facility] QUALITY COORDINATOR. )

## 2022-12-25 ENCOUNTER — Telehealth: Payer: Self-pay

## 2022-12-25 NOTE — Telephone Encounter (Signed)
Copied from CRM [id number]. Topic: General - Inquiry >> Dec 25, 2022 10:30 AM Clide Dales wrote: [name] with [facility] called to see if patient's metformin and valsartan has been discontinued. [name] stated that patient filled the rx on 1/8, but has not got a refill since. Please advise. [phone].

## 2022-12-31 NOTE — Telephone Encounter (Signed)
LVM- need office visit for chronic disease follow up.

## 2023-02-04 ENCOUNTER — Encounter: Payer: Self-pay | Admitting: Family Medicine

## 2023-02-04 ENCOUNTER — Ambulatory Visit: Payer: BC Managed Care – PPO | Admitting: Family Medicine

## 2023-02-04 VITALS — BP 197/85 | HR 75 | Ht 64.0 in | Wt 150.9 lb

## 2023-02-04 DIAGNOSIS — R634 Abnormal weight loss: Secondary | ICD-10-CM

## 2023-02-04 DIAGNOSIS — I152 Hypertension secondary to endocrine disorders: Secondary | ICD-10-CM

## 2023-02-04 DIAGNOSIS — J441 Chronic obstructive pulmonary disease with (acute) exacerbation: Secondary | ICD-10-CM | POA: Diagnosis not present

## 2023-02-04 DIAGNOSIS — E1159 Type 2 diabetes mellitus with other circulatory complications: Secondary | ICD-10-CM | POA: Diagnosis not present

## 2023-02-04 DIAGNOSIS — K130 Diseases of lips: Secondary | ICD-10-CM | POA: Diagnosis not present

## 2023-02-04 MED ORDER — DOXYCYCLINE HYCLATE 100 MG PO TABS: 100.0000 mg | ORAL_TABLET | Freq: Two times a day (BID) | ORAL | 0 refills | Status: AC

## 2023-02-04 MED ORDER — LOSARTAN POTASSIUM 100 MG PO TABS: 100.0000 mg | ORAL_TABLET | Freq: Every day | ORAL | 0 refills | Status: DC

## 2023-02-04 NOTE — Assessment & Plan Note (Signed)
Acute  Has lost 15 pounds in the past year  Etiology unclear at this time

## 2023-02-04 NOTE — Assessment & Plan Note (Addendum)
Uncontrolled  Asymptomatic, no signs of hypertensive urgency/emergency Restart losartan 100 mg  Fu in 1 week for BP recheck

## 2023-02-04 NOTE — Progress Notes (Signed)
Established Patient Office Visit  Subjective   Patient ID: Tammy Stevenson, female    DOB: [birth year]  Age: 72 y.o. MRN: [id number]  Chief Complaint  Patient presents with   Medical Management of Chronic Issues    Cough has been a problem since Friday, prior treatment has been robitussin, has been ineffective, spot on lip has been a problem for awhile, like a few months, possible skin cancer.     Tammy Stevenson is here today with multiple health concerns.  She has a five day history of a wet cough. Per daughter, this happens every time she goes outside around dust and hay. Her siblings are experiencing the same symptoms. She denies problems with congestion, sore throat, or fever. She endorses using her inhaler.  She has a spot on her lower lip that has been there for a couple of months. At first they thought it was a cold sore. However the spot enlarged and crusted over. Denies bleeding, pain, or itching of the lesion.  BP is significantly elevated. Denies symptoms associated with high BP. Per daughter, most recent provider made changes to her BP meds she did not agree with and would not send in refill for the old ones.    Patient Active Problem List   Diagnosis Date Noted   Lip lesion 02/04/2023   Unintentional weight loss 02/04/2023   Acute exacerbation of chronic obstructive pulmonary disease (COPD) (HCC) 05/27/2022   Hyperlipidemia associated with type 2 diabetes mellitus (HCC) 05/27/2022   Hypertension associated with diabetes (HCC) 05/27/2022   Tobacco use disorder 05/27/2022   Screening mammogram for breast cancer 05/27/2022   Uncontrolled type 2 diabetes mellitus with hyperglycemia (HCC) 05/27/2022   Onychomycosis 05/27/2022   Bilateral hearing loss 07/10/2015      Review of Systems  Constitutional:  Positive for weight loss. Negative for chills and fever.  Eyes:  Negative for blurred vision, double vision and photophobia.  Respiratory:  Positive for cough and sputum production.    Skin:  Negative for itching.  Neurological:  Negative for dizziness, tremors, sensory change, speech change, weakness and headaches.      Objective:     BP (!) 197/85 (BP Location: Left Arm, Patient Position: Sitting, Cuff Size: Large)   Pulse 75   Ht 5\' 4"  (1.626 m)   Wt 150 lb 14.4 oz (68.4 kg)   SpO2 98%   BMI 25.90 kg/m  BP Readings from Last 3 Encounters:  02/04/23 (!) 197/85  05/27/22 (!) 203/90  03/15/21 (!) 144/61   Wt Readings from Last 3 Encounters:  02/04/23 150 lb 14.4 oz (68.4 kg)  05/27/22 165 lb (74.8 kg)  03/15/21 169 lb (76.7 kg)    Physical Exam Constitutional:      Appearance: Normal appearance.  HENT:     Mouth/Throat:     Lips: Lesions present.     Comments: Ulcerated crusted lesion on the lower lip brown in color  Eyes:     Conjunctiva/sclera: Conjunctivae normal.     Pupils: Pupils are equal, round, and reactive to light.  Cardiovascular:     Rate and Rhythm: Normal rate and regular rhythm.     Heart sounds: Normal heart sounds.  Pulmonary:     Breath sounds: Wheezing present.  Neurological:     General: No focal deficit present.     Mental Status: She is alert and oriented to person, place, and time.        No results found for any visits on 02/04/23.  Last CBC Lab Results  Component Value Date   WBC 9.1 05/27/2022   HGB 12.9 05/27/2022   HCT 38.6 05/27/2022   MCV 83 05/27/2022   MCH 27.7 05/27/2022   RDW 12.5 05/27/2022   PLT 231 05/27/2022   Last metabolic panel Lab Results  Component Value Date   GLUCOSE 192 (H) 05/27/2022   NA 139 05/27/2022   K 4.1 05/27/2022   CL 100 05/27/2022   CO2 25 05/27/2022   BUN 8 05/27/2022   CREATININE 0.83 05/27/2022   EGFR 76 05/27/2022   CALCIUM 9.1 05/27/2022   PROT 6.9 05/27/2022   ALBUMIN 4.2 05/27/2022   LABGLOB 2.7 05/27/2022   AGRATIO 1.6 05/27/2022   BILITOT 0.6 05/27/2022   ALKPHOS 85 05/27/2022   AST 15 05/27/2022   ALT 9 05/27/2022   Last lipids Lab Results   Component Value Date   CHOL 163 05/27/2022   HDL 39 (L) 05/27/2022   LDLCALC 103 (H) 05/27/2022   TRIG 115 05/27/2022   CHOLHDL 4.2 05/27/2022   Last hemoglobin A1c Lab Results  Component Value Date   HGBA1C 8.1 (H) 05/27/2022      The 10-year ASCVD risk score (Arnett DK, et al., 2019) is: 50.1%    Assessment & Plan:   Problem List Items Addressed This Visit       Cardiovascular and Mediastinum   Hypertension associated with diabetes (HCC) - Primary    Uncontrolled  Asymptomatic, no signs of hypertensive urgency/emergency Restart losartan 100 mg  Fu in 1 week for BP recheck       Relevant Medications   losartan (COZAAR) 100 MG tablet     Respiratory   Acute exacerbation of chronic obstructive pulmonary disease (COPD) (HCC)    Acute  Prednisone contraindicated at this time due to significant hypertension  Doxycline BID prescribed due to allergy to azithromycin  Fu in 1 week, if BP under control will plan to give prednisone         Digestive   Lip lesion    Lip lesion highly concerning for cancer due to duration and tobacco use disorder Referral to dermatology for biopsy and further workup and management       Relevant Orders   Ambulatory referral to Dermatology     Other   Unintentional weight loss    Acute  Has lost 15 pounds in the past year  Etiology unclear at this time        Return in about 1 week (around 02/11/2023) for BP f/u, With PCP.    Tammy Stevenson, Medical Student  Patient seen along with MS3 student Tammy Stevenson. I personally evaluated this patient along with the student, and verified all aspects of the history, physical exam, and medical decision making as documented by the student. I agree with the student's documentation and have made all necessary edits.  Tammy Stevenson, Tammy Schlein, MD, MPH [facility]

## 2023-02-04 NOTE — Assessment & Plan Note (Signed)
Lip lesion highly concerning for cancer due to duration and tobacco use disorder Referral to dermatology for biopsy and further workup and management

## 2023-02-04 NOTE — Assessment & Plan Note (Signed)
Acute  Prednisone contraindicated at this time due to significant hypertension  Doxycline BID prescribed due to allergy to azithromycin  Fu in 1 week, if BP under control will plan to give prednisone

## 2023-02-11 ENCOUNTER — Encounter: Payer: Self-pay | Admitting: Family Medicine

## 2023-02-11 ENCOUNTER — Ambulatory Visit: Payer: BC Managed Care – PPO | Admitting: Family Medicine

## 2023-02-11 VITALS — BP 149/54 | HR 67 | Temp 98.3°F | Ht 64.0 in | Wt 150.7 lb

## 2023-02-11 DIAGNOSIS — Z7984 Long term (current) use of oral hypoglycemic drugs: Secondary | ICD-10-CM

## 2023-02-11 DIAGNOSIS — J441 Chronic obstructive pulmonary disease with (acute) exacerbation: Secondary | ICD-10-CM | POA: Diagnosis not present

## 2023-02-11 DIAGNOSIS — I152 Hypertension secondary to endocrine disorders: Secondary | ICD-10-CM

## 2023-02-11 DIAGNOSIS — E1159 Type 2 diabetes mellitus with other circulatory complications: Secondary | ICD-10-CM | POA: Diagnosis not present

## 2023-02-11 DIAGNOSIS — E1165 Type 2 diabetes mellitus with hyperglycemia: Secondary | ICD-10-CM | POA: Diagnosis not present

## 2023-02-11 MED ORDER — BUDESONIDE-FORMOTEROL FUMARATE 80-4.5 MCG/ACT IN AERO
2.0000 | INHALATION_SPRAY | Freq: Two times a day (BID) | RESPIRATORY_TRACT | 3 refills | Status: DC
Start: 2023-02-11 — End: 2023-06-04

## 2023-02-11 MED ORDER — METFORMIN HCL ER 500 MG PO TB24
1000.0000 mg | ORAL_TABLET | Freq: Two times a day (BID) | ORAL | 1 refills | Status: DC
Start: 2023-02-11 — End: 2023-05-12

## 2023-02-11 MED ORDER — LOSARTAN POTASSIUM 100 MG PO TABS
100.0000 mg | ORAL_TABLET | Freq: Every day | ORAL | 1 refills | Status: DC
Start: 2023-02-11 — End: 2023-05-12

## 2023-02-11 NOTE — Assessment & Plan Note (Addendum)
Counseled patient on correct dose and medication adherence. Patient will be checking to see if her dosage is 500 mg as we have documented or 1000 mg as she believes. Also counseled patient regarding high carbohydrate foods and their impact on her blood sugar.

## 2023-02-11 NOTE — Patient Instructions (Addendum)
Double check dosage on metformin - we have 500 mg tablets listed and you believe it's 1000 mg each. Please let me know which is correct.  Schedule an eye exam  Take your metformin twice a day. (The dose should be 1000 mg two times per day).   [facility] [address] [place],  [place]  [address] Get Driving Directions Main: [phone]

## 2023-02-11 NOTE — Assessment & Plan Note (Addendum)
Blood pressure better controlled today.   Patient's symptoms have overall resolved at this point. Prescribed Symbicort for better symptom control going forward. Patient to continue with albuterol as needed

## 2023-02-16 NOTE — Assessment & Plan Note (Addendum)
Patient's blood pressure is significantly better controlled today. Since it has only been 1 week and given patient's advanced age, will maintain current dose and reassess on follow-up visit.

## 2023-02-28 DIAGNOSIS — I152 Hypertension secondary to endocrine disorders: Secondary | ICD-10-CM | POA: Diagnosis not present

## 2023-02-28 DIAGNOSIS — E1159 Type 2 diabetes mellitus with other circulatory complications: Secondary | ICD-10-CM | POA: Diagnosis not present

## 2023-03-01 LAB — COMPREHENSIVE METABOLIC PANEL
ALT: 10 [IU]/L (ref 0–32)
AST: 15 [IU]/L (ref 0–40)
Albumin: 4 g/dL (ref 3.8–4.8)
Alkaline Phosphatase: 74 [IU]/L (ref 44–121)
BUN/Creatinine Ratio: 14 (ref 12–28)
BUN: 11 mg/dL (ref 8–27)
Bilirubin Total: 0.4 mg/dL (ref 0.0–1.2)
CO2: 23 mmol/L (ref 20–29)
Calcium: 9.4 mg/dL (ref 8.7–10.3)
Chloride: 102 mmol/L (ref 96–106)
Creatinine, Ser: 0.79 mg/dL (ref 0.57–1.00)
Globulin, Total: 2 g/dL (ref 1.5–4.5)
Glucose: 130 mg/dL — ABNORMAL HIGH (ref 70–99)
Potassium: 4.7 mmol/L (ref 3.5–5.2)
Sodium: 139 mmol/L (ref 134–144)
Total Protein: 6 g/dL (ref 6.0–8.5)
eGFR: 80 mL/min/{1.73_m2} (ref >59)

## 2023-03-01 LAB — LIPID PANEL
Chol/HDL Ratio: 3.1 {ratio} (ref 0.0–4.4)
Cholesterol, Total: 179 mg/dL (ref 100–199)
HDL: 57 mg/dL (ref >39)
LDL Chol Calc (NIH): 98 mg/dL (ref 0–99)
Triglycerides: 135 mg/dL (ref 0–149)
VLDL Cholesterol Cal: 24 mg/dL (ref 5–40)

## 2023-03-01 LAB — HEMOGLOBIN A1C
Est. average glucose Bld gHb Est-mCnc: 154 mg/dL
Hgb A1c MFr Bld: 7 % — ABNORMAL HIGH (ref 4.8–5.6)

## 2023-03-05 ENCOUNTER — Ambulatory Visit: Payer: BC Managed Care – PPO | Admitting: Dermatology

## 2023-03-05 ENCOUNTER — Encounter: Payer: Self-pay | Admitting: Dermatology

## 2023-03-05 VITALS — BP 151/86 | HR 72

## 2023-03-05 DIAGNOSIS — D492 Neoplasm of unspecified behavior of bone, soft tissue, and skin: Secondary | ICD-10-CM

## 2023-03-05 DIAGNOSIS — C4402 Squamous cell carcinoma of skin of lip: Secondary | ICD-10-CM

## 2023-03-05 DIAGNOSIS — D485 Neoplasm of uncertain behavior of skin: Secondary | ICD-10-CM

## 2023-03-05 DIAGNOSIS — C4492 Squamous cell carcinoma of skin, unspecified: Secondary | ICD-10-CM

## 2023-03-05 DIAGNOSIS — F17220 Nicotine dependence, chewing tobacco, uncomplicated: Secondary | ICD-10-CM | POA: Diagnosis not present

## 2023-03-05 HISTORY — DX: Squamous cell carcinoma of skin, unspecified: C44.92

## 2023-03-05 NOTE — Progress Notes (Signed)
New Patient Visit   Subjective  My Bongiovanni is a 72 y.o. female accompanied by daughter Hilda Lias) who presents for the following: Lip Lesion  Patient states she has lesion located at the bottom lip that she would like to have examined. Patient reports the areas have been there for 3 months. She reports the areas are not bothersome. Patient rates irritation 0 out of 10. She states that the areas have not spread. Patient reports she has not previously been treated for these areas. Patient reports 30 years of chewing tobacco. Patient denies Hx of bx. Patient reports family history of skin cancer(s) (Sister).  The patient has spots, moles and lesions to be evaluated, some may be new or changing and the patient may have concern these could be cancer.  The following portions of the chart were reviewed this encounter and updated as appropriate: medications, allergies, medical history  Review of Systems:  No other skin or systemic complaints except as noted in HPI or Assessment and Plan.  Objective  Well appearing patient in no apparent distress; mood and affect are within normal limits.  A focused examination was performed of the following areas: Bottom Lip  Relevant exam findings are noted in the Assessment and Plan.  Right Side Lower Lip 1 cm Crusted Papule       Assessment & Plan    1. Suspected squamous cell carcinoma on the right lower lip - Assessment: History of chewing tobacco for 30 years. 1 cm crusted papule on the right lower lip, present for 3 months. - Plan:   a. Perform shave biopsy during the visit for histopathological examination.   b. Educate patient on the risks associated with chewing tobacco and emphasize the importance of follow-up.   c. Await biopsy results and inform the patient of the findings.   d. If confirmed as squamous cell carcinoma, refer the patient to a surgeon (Dr. Caralyn Guile) for excision and further management.   e. Instruct the patient to apply  Aquaphor or Vaseline on the biopsy site for wound care, as detailed in the discharge papers.  2. Tobacco use disorder - Assessment: 30 years of chewing tobacco use. - Plan:   a. Counsel the patient on the risks associated with tobacco use, particularly in relation to oral health and the development of skin cancers.   b. Discuss cessation strategies and provide resources for quitting tobacco use.   c. Encourage regular follow-up visits for monitoring and support in tobacco cessation efforts.   Procedure Note Neoplasm of uncertain behavior of skin Right Side Lower Lip  Skin / nail biopsy Type of biopsy: tangential   Informed consent: discussed and consent obtained   Timeout: patient name, date of birth, surgical site, and procedure verified   Procedure prep:  Patient was prepped and draped in usual sterile fashion Prep type:  Isopropyl alcohol Anesthesia: the lesion was anesthetized in a standard fashion   Anesthetic:  1% lidocaine w/ epinephrine 1-100,000 buffered w/ 8.4% NaHCO3 Instrument used: DermaBlade   Hemostasis achieved with: aluminum chloride   Outcome: patient tolerated procedure well   Post-procedure details: sterile dressing applied and wound care instructions given   Dressing type: petrolatum gauze and bandage    Specimen 1 - Surgical pathology Differential Diagnosis: R/O SCC  Check Margins: No    No follow-ups on file.    Documentation: I have reviewed the above documentation for accuracy and completeness, and I agree with the above.  I, Nell Range, am acting as Neurosurgeon for  Langston Reusing, DO.  Langston Reusing, DO

## 2023-03-05 NOTE — Patient Instructions (Addendum)

## 2023-03-06 LAB — SURGICAL PATHOLOGY

## 2023-03-10 NOTE — Progress Notes (Signed)
Hi Hollie,  Please call patient and refer to Mohs with Dr Caralyn Guile for positive skin cancer(s)  Thanks!  Diagnosis Skin , right side lower lip WELL DIFFERENTIATED SQUAMOUS CELL CARCINOMA

## 2023-03-13 ENCOUNTER — Telehealth: Payer: Self-pay

## 2023-03-13 DIAGNOSIS — C44320 Squamous cell carcinoma of skin of unspecified parts of face: Secondary | ICD-10-CM

## 2023-03-13 NOTE — Telephone Encounter (Signed)
-----   Message from Langston Reusing sent at 03/10/2023  5:01 PM EDT ----- Enrique Sack,  Please call patient and refer to Mohs with Dr Caralyn Guile for positive skin cancer(s)  Thanks!  Diagnosis Skin , right side lower lip WELL DIFFERENTIATED SQUAMOUS CELL CARCINOMA

## 2023-03-13 NOTE — Telephone Encounter (Signed)
Advised patient's daughter of results and advised that she will need Mohs surgery with Dr Caralyn Guile. Refer sent for Mohs today/hd

## 2023-03-26 ENCOUNTER — Encounter: Payer: Self-pay | Admitting: Dermatology

## 2023-03-26 ENCOUNTER — Ambulatory Visit: Payer: BC Managed Care – PPO | Admitting: Dermatology

## 2023-03-26 VITALS — BP 137/79 | HR 66 | Temp 98.1°F

## 2023-03-26 DIAGNOSIS — Z7189 Other specified counseling: Secondary | ICD-10-CM | POA: Diagnosis not present

## 2023-03-26 DIAGNOSIS — L579 Skin changes due to chronic exposure to nonionizing radiation, unspecified: Secondary | ICD-10-CM | POA: Diagnosis not present

## 2023-03-26 DIAGNOSIS — W908XXA Exposure to other nonionizing radiation, initial encounter: Secondary | ICD-10-CM | POA: Diagnosis not present

## 2023-03-26 DIAGNOSIS — L814 Other melanin hyperpigmentation: Secondary | ICD-10-CM

## 2023-03-26 DIAGNOSIS — C4492 Squamous cell carcinoma of skin, unspecified: Secondary | ICD-10-CM

## 2023-03-26 DIAGNOSIS — C4402 Squamous cell carcinoma of skin of lip: Secondary | ICD-10-CM | POA: Diagnosis not present

## 2023-03-26 MED ORDER — MUPIROCIN 2 % EX OINT: 1.0000 | TOPICAL_OINTMENT | Freq: Two times a day (BID) | CUTANEOUS | 2 refills | Status: DC

## 2023-03-26 MED ORDER — OXYCODONE HCL 5 MG PO CAPS: 5.0000 mg | ORAL_CAPSULE | ORAL | 0 refills | Status: DC | PRN

## 2023-03-26 MED ORDER — CLINDAMYCIN HCL 300 MG PO CAPS
300.0000 mg | ORAL_CAPSULE | Freq: Three times a day (TID) | ORAL | 0 refills | Status: AC
Start: 2023-03-26 — End: 2023-04-02

## 2023-03-26 NOTE — Patient Instructions (Addendum)
Please call [phone] to schedule your CT scan   Important Information   Due to recent changes in healthcare laws, you may see results of your pathology and/or laboratory studies on MyChart before the doctors have had a chance to review them. We understand that in some cases there may be results that are confusing or concerning to you. Please understand that not all results are received at the same time and often the doctors may need to interpret multiple results in order to provide you with the best plan of care or course of treatment. Therefore, we ask that you please give [place] 2 business days to thoroughly review all your results before contacting the office for clarification. Should we see a critical lab result, you will be contacted sooner.     If You Need Anything After Your Visit   If you have any questions or concerns for your doctor, please call our main line at [phone]. If no one answers, please leave a voicemail as directed and we will return your call as soon as possible. Messages left after 4 pm will be answered the following business day.    You may also send [place] a message via MyChart. We typically respond to MyChart messages within 1-2 business days.  For prescription refills, please ask your pharmacy to contact our office. Our fax number is [phone].  If you have an urgent issue when the clinic is closed that cannot wait until the next business day, you can page your doctor at the number below.     Please note that while we do our best to be available for urgent issues outside of office hours, we are not available 24/7.    If you have an urgent issue and are unable to reach [place], you may choose to seek medical care at your doctor's office, retail clinic, urgent care center, or emergency room.   If you have a medical emergency, please immediately call 911 or go to the emergency department. In the event of inclement weather, please call our main line at [phone] for an  update on the status of any delays or closures.  Dermatology Medication Tips: Please keep the boxes that topical medications come in in order to help keep track of the instructions about where and how to use these. Pharmacies typically print the medication instructions only on the boxes and not directly on the medication tubes.   If your medication is too expensive, please contact our office at [phone] or send [place] a message through MyChart.    We are unable to tell what your co-pay for medications will be in advance as this is different depending on your insurance coverage. However, we may be able to find a substitute medication at lower cost or fill out paperwork to get insurance to cover a needed medication.    If a prior authorization is required to get your medication covered by your insurance company, please allow [place] 1-2 business days to complete this process.   Drug prices often vary depending on where the prescription is filled and some pharmacies may offer cheaper prices.   The website www.goodrx.com contains coupons for medications through different pharmacies. The prices here do not account for what the cost may be with help from insurance (it may be cheaper with your insurance), but the website can give you the price if you did not use any insurance.  - You can print the associated coupon and take it with your prescription to the pharmacy.  -  You may also stop by our office during regular business hours and pick up a GoodRx coupon card.  - If you need your prescription sent electronically to a different pharmacy, notify our office through [facility] or by phone at [phone]

## 2023-03-26 NOTE — Progress Notes (Signed)
Follow-Up Visit   Subjective  Tammy Stevenson is a 72 y.o. female who presents for the following:Mohs of a well differentiated SCC on the right lower mucosal lip referred by Dr. Langston Reusing.   The following portions of the chart were reviewed this encounter and updated as appropriate: medications, allergies, medical history  Review of Systems:  No other skin or systemic complaints except as noted in HPI or Assessment and Plan.  Objective  Well appearing patient in no apparent distress; mood and affect are within normal limits.  A focused examination was performed of the following areas: Right side lower lip Relevant physical exam findings are noted in the Assessment and Plan.   right side lower lip          Assessment & Plan   Squamous cell carcinoma of skin right side lower lip  Mohs surgery  Consent obtained: written  Anticoagulation: Is the patient taking prescription anticoagulant and/or aspirin prescribed/recommended by a physician? No   Was the anticoagulation regimen changed prior to Mohs? No    Procedure Details: Timeout: pre-procedure verification complete Procedure Prep: patient was prepped and draped in usual sterile fashion Prep type: chlorhexidine Frozen section biopsy performed: Yes   Specimen debulked: Yes   Pre-Op diagnosis: squamous cell carcinoma SCC subtype: moderately differentiated Additional SCC characteristics: perineural/intraneural invasion and tumor invasion beyond fat BWH stage: T2b MohsAIQ Surgical site (if tumor spans multiple areas, please select predominant area): vermilion lip Surgery side: right Surgical site (from skin exam): right side lower lip Pre-operative length (cm): 2 Pre-operative width (cm): 1.6 Indications for Mohs surgery: anatomic location where tissue conservation is critical, tumor size greater than 2 cm, ill-defined borders and aggressive histology Previously treated? No   Mohs Appropriate Use Criteria  Score: 9  Micrographic Surgery Details: Post-operative length (cm): 3 Post-operative width (cm): 1.8 Number of Mohs stages: 2 Number of sections submitted for permanent interpretation: 2 Indication for sending permanent sections: evaluate a debulking specimen and tumor staging Is this a complex case (associate members only): Yes    Stage 1    Tumor features identified on Mohs section: squamous cell carcinoma    Depth of defect after stage: dermis, subcutaneous fat and skeletal muscle    Perineural invasion: perineural invasion  Stage 2    Tumor features identified on Mohs section: no tumor identified    Perineural invasion: no perineural invasion  Patient tolerance of procedure: tolerated well, no immediate complications  Reconstruction: Was the defect reconstructed?: No    Opioids: Did the patient recieve a prescription for opioid/narcotic related to Mohs surgery? Yes   Indications for opioid/narcotics: patient required additional pain relief despite trial of non-opioid analgesia  Related Procedures CT SOFT TISSUE NECK W WO CONTRAST Ambulatory referral to Radiation Oncology  Related Medications mupirocin ointment (BACTROBAN) 2 % Apply 1 Application topically 2 (two) times Stevenson. To wound  oxycodone (OXY-IR) 5 MG capsule Take 1 capsule (5 mg total) by mouth every 4 (four) hours as needed.  clindamycin (CLEOCIN) 300 MG capsule Take 1 capsule (300 mg total) by mouth 3 (three) times Stevenson for 7 days.    Return in about 2 weeks (around 04/09/2023).  I, Tillie Fantasia, CMA, am acting as scribe for Tammy Daily, MD.   Documentation: I have reviewed the above documentation for accuracy and completeness, and I agree with the above.   03/26/2023  HISTORY OF PRESENT ILLNESS  Tammy Stevenson is seen in consultation at the request of Dr. Onalee Hua for biopsy-proven  Well Differentiated Squamous Cell Carcinoma of the right lower mucosal lip. They note that the area has been present  for about 3 months increasing in size with bleeding.  There is no history of previous treatment.  Reports no other new or changing lesions and has no other complaints today.  Medications and allergies: see patient chart.  Review of systems: Reviewed 8 systems and notable for the above skin cancer.  All other systems reviewed are unremarkable/negative, unless noted in the HPI. Past medical history, surgical history, family history, social history were also reviewed and are noted in the chart/questionnaire.    PHYSICAL EXAMINATION  General: Well-appearing, in no acute distress, alert and oriented x 4. Vitals reviewed in chart (if available).   Skin: Exam reveals a 2 x 1.6 cm erythematous papule and biopsy scar on the right lower mucosal lip. There are rhytids, telangiectasias, and lentigines, consistent with photodamage. Lymph nodes: No cervical lymphadenopathy  Biopsy report(s) reviewed, confirming the diagnosis.   ASSESSMENT  1) Well Differentiated Squamous Cell Carcinoma of the right lower mucosal lip  2) photodamage 3) solar lentigines   PLAN   1. Due to location, size, histology, or recurrence and the likelihood of subclinical extension as well as the need to conserve normal surrounding tissue, the patient was deemed acceptable for Mohs micrographic surgery (MMS).  The nature and purpose of the procedure, associated benefits and risks including recurrence and scarring, possible complications such as pain, infection, and bleeding, and alternative methods of treatment if appropriate were discussed with the patient during consent. The lesion location was verified by the patient, by reviewing previous notes, pathology reports, and by photographs as well as angulation measurements if available.  Informed consent was reviewed and signed by the patient, and timeout was performed at 9:15 AM. See op note below.  2. For the photodamage and solar lentigines, sun protection discussed/information given  on OTC sunscreens, and we recommend continued regular follow-up with primary dermatologist every 6 months or sooner for any growing, bleeding, or changing lesions. 3. Prognosis and future surveillance discussed. 4. Letter with treatment outcome sent to referring provider. 5. Pain acetaminophen/ibuprofen/oxycodone 5 mg  MOHS MICROGRAPHIC SURGERY AND RECONSTRUCTION  Initial size:   2.0 x 1.6 cm Surgical defect/wound size: 3.0 x 1.8 cm Anesthesia:    0.33% lidocaine with 1:200,000 epinephrine EBL:    <5 mL Complications:  None Repair type:   Secondary Intention  Stages: 2  STAGE I: Anesthesia achieved with 0.5% lidocaine with 1:200,000 epinephrine. ChloraPrep applied. 1 section(s) excised using Mohs technique (this includes total peripheral and deep tissue margin excision and evaluation with frozen sections, excised and interpreted by the same physician). The tumor was first debulked and then excised with an approx. 2 mm margin.  Hemostasis was achieved with electrocautery as needed.  The specimen was then oriented, subdivided/relaxed, inked, and processed using Mohs technique.    Frozen section analysis revealed a positive margin for dysplastic stratified squamous epithelium that extends through the basement membrane and into the underlying fibrous connective tissue without attachment to the surface with malignant epithelial cells showing eosinophilic cytoplasm, hyperchromatic nuclei, pleomorphism, mitotic activity, individual cell keratinization and intercellular bridging in the deep margin with evidence of perineural invasion.    STAGE II: An additional 2 mm margin was excised.  Hemostasis was achieved with electrocautery as needed.  The specimen was then oriented, subdivided/relaxed, inked, and processed using Mohs technique. Evaluation of slides by the Mohs surgeon revealed clear tumor margins.  A vertical frozen  section was processed from the tumor de bulk and found to show changes  consistent with moderately differentiated Squamous Cell Carcinoma.   Deeper cuts of the first stage block showed evidence of perineural invasion. Based on the [facility] and [facility] Head and Neck Staging, the patient meets criteria for stage T2b, for which Head/Neck CT is recommended for assessment of lymph nodes and local spread. Debulk and Stage 1 blocks will be sent for permanent sectioning.   Discussion was had with patient and her mother regarding imaging recommendation and referral for Radiation Oncology to consider local post operative radiation due to PNI.  Secondary Intention  Patient was notified of results and repair options were discussed, including second intention healing. After reviewing the advantages and disadvantages of each, we agreed on second intention healing as appropriate.   The surgical site was then lightly scrubbed with sterile, saline-soaked gauze.  The area was bandaged using Vaseline ointment, non-adherent gauze, gauze pads, and tape to provide an adequate pressure dressing.   The patient tolerated the procedure well, was given detailed written and verbal wound care instructions, and was discharged in good condition.  The patient will follow-up in 2 weeks and as scheduled with primary dermatologist.  Return in about 2 weeks (around 04/09/2023).   We spent 45 min reviewing records, taking the patient history, providing face to face care with the patient, sending prescriptions.   Tammy Daily, MD

## 2023-03-31 LAB — SURGICAL PATHOLOGY

## 2023-04-02 ENCOUNTER — Ambulatory Visit: Admission: RE | Admit: 2023-04-02 | Payer: BC Managed Care – PPO | Source: Ambulatory Visit

## 2023-04-03 ENCOUNTER — Ambulatory Visit
Admission: RE | Admit: 2023-04-03 | Discharge: 2023-04-03 | Disposition: A | Discharge status: Home / Self Care | Payer: BC Managed Care – PPO | Source: Ambulatory Visit | Attending: Radiation Oncology | Admitting: Radiation Oncology

## 2023-04-03 ENCOUNTER — Encounter: Payer: Self-pay | Admitting: Radiation Oncology

## 2023-04-03 VITALS — BP 151/59 | HR 50 | Resp 16 | Ht 64.0 in | Wt 164.9 lb

## 2023-04-03 DIAGNOSIS — Z7951 Long term (current) use of inhaled steroids: Secondary | ICD-10-CM | POA: Diagnosis not present

## 2023-04-03 DIAGNOSIS — Z79899 Other long term (current) drug therapy: Secondary | ICD-10-CM | POA: Diagnosis not present

## 2023-04-03 DIAGNOSIS — E119 Type 2 diabetes mellitus without complications: Secondary | ICD-10-CM | POA: Insufficient documentation

## 2023-04-03 DIAGNOSIS — C4402 Squamous cell carcinoma of skin of lip: Secondary | ICD-10-CM | POA: Insufficient documentation

## 2023-04-03 DIAGNOSIS — Z85828 Personal history of other malignant neoplasm of skin: Secondary | ICD-10-CM | POA: Diagnosis not present

## 2023-04-03 DIAGNOSIS — Z7984 Long term (current) use of oral hypoglycemic drugs: Secondary | ICD-10-CM | POA: Diagnosis not present

## 2023-04-03 MED ORDER — SUCRALFATE 1 G PO TABS: 1.0000 g | ORAL_TABLET | Freq: Three times a day (TID) | ORAL | 4 refills | Status: DC

## 2023-04-03 NOTE — Consult Note (Signed)
NEW PATIENT EVALUATION  Name: Tammy Stevenson  MRN: [id number]  Date:   04/03/2023     DOB: 1951/05/07   This 72 y.o. 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  Tumor was stage T2b.  REFERRING PHYSICIAN: Pardue, Monico Blitz, DO  CHIEF COMPLAINT:  Chief Complaint  Patient presents with   Skin Cancer    DIAGNOSIS: The encounter diagnosis was Squamous cell carcinoma of skin of lower lip.   PREVIOUS INVESTIGATIONS:  Pathology reports reviewed CT scan ordered we will review prior to initiating treatment Clinical notes reviewed  HPI: Patient is a 72 year old female who presents with a slow-growing mass on the right lower lip over several months.  She was seen by dermatology where a biopsy was performed showing well-differentiated squamous cell carcinoma.  She went on to have Mohs chemosurgery again showing moderately differentiated squamous cell carcinoma with perineural invasion.  Tumor measured 2 x 1.6 cm.  Tumor invaded into the dermis subcutaneous fat and skeletal muscle.  Perineural invasion was identified.  A CT scan of the soft tissue head and neck has been ordered.  She is seen today for consideration of adjuvant radiation therapy.  She is doing well she is having some painful gums.  Not sure the etiology of that.  PLANNED TREATMENT REGIMEN: Electron-beam therapy  PAST MEDICAL HISTORY:  has a past medical history of Diabetes (HCC) and Squamous cell carcinoma of skin (03/05/2023).    PAST SURGICAL HISTORY:  Past Surgical History:  Procedure Laterality Date   ANKLE SURGERY Right    CATARACT EXTRACTION W/PHACO Right 01/11/2021   Procedure: CATARACT EXTRACTION PHACO AND INTRAOCULAR LENS PLACEMENT (IOC) RIGHT DIABETIC 22.66 01:43.4;  Surgeon: Galen Manila, MD;  Location: [facility] ORS;  Service: Ophthalmology;  Laterality: Right;    CATARACT EXTRACTION W/PHACO Left 03/15/2021   Procedure: CATARACT EXTRACTION PHACO AND INTRAOCULAR LENS PLACEMENT (IOC) LEFT DIABETIC;  Surgeon: Galen Manila, MD;  Location: [facility] ORS;  Service: Ophthalmology;  Laterality: Left;  7.17 0:50.4    FAMILY HISTORY: family history includes Heart disease in her mother; Hypertension in her father; Lupus in her sister; Stroke in her mother.  SOCIAL HISTORY:  reports that she has never smoked. She has been exposed to tobacco smoke. She uses smokeless tobacco. She reports that she does not drink alcohol and does not use drugs.  ALLERGIES: Azithromycin  MEDICATIONS:  Current Outpatient Medications  Medication Sig Dispense Refill   sucralfate (CARAFATE) 1 g tablet Take 1 tablet (1 g total) by mouth 4 (four) times daily -  with meals and at bedtime. Dissolve in warm water 120 tablet 4   albuterol (PROAIR HFA) 108 (90 Base) MCG/ACT inhaler Inhale 2 puffs into the lungs every 6 (six) hours as needed for wheezing or shortness of breath. 8 g 0   budesonide-formoterol (SYMBICORT) 80-4.5 MCG/ACT inhaler Inhale 2 puffs into the lungs 2 (two) times daily. 1 each 3   colesevelam (WELCHOL) 625 MG tablet Take 3 tablets (1,875 mg total) by mouth 2 (two) times daily with a meal. 600 tablet 3   losartan (COZAAR) 100 MG tablet Take 1 tablet (100 mg total) by mouth daily. 90 tablet 1   metFORMIN (GLUCOPHAGE-XR) 500 MG 24 hr tablet Take 2 tablets (1,000 mg total) by mouth 2 (two) times daily with a meal. 120 tablet 1   mupirocin ointment (BACTROBAN) 2 % Apply 1 Application topically 2 (  two) times daily. To wound 22 g 2   oxycodone (OXY-IR) 5 MG capsule Take 1 capsule (5 mg total) by mouth every 4 (four) hours as needed. 30 capsule 0   rosuvastatin (CRESTOR) 40 MG tablet Take 1 tablet (40 mg total) by mouth daily. (Patient not taking: Reported on 03/05/2023) 30 tablet 1   No current facility-administered medications for this encounter.    ECOG PERFORMANCE STATUS:  0  - Asymptomatic  REVIEW OF SYSTEMS: Patient denies any weight loss, fatigue, weakness, fever, chills or night sweats. Patient denies any loss of vision, blurred vision. Patient denies any ringing  of the ears or hearing loss. No irregular heartbeat. Patient denies heart murmur or history of fainting. Patient denies any chest pain or pain radiating to her upper extremities. Patient denies any shortness of breath, difficulty breathing at night, cough or hemoptysis. Patient denies any swelling in the lower legs. Patient denies any nausea vomiting, vomiting of blood, or coffee ground material in the vomitus. Patient denies any stomach pain. [name] has had normal bowel movements no significant constipation or diarrhea. Patient denies any dysuria, hematuria or significant nocturia. Patient denies any problems walking, swelling in the joints or loss of balance. Patient denies any skin changes, loss of hair or loss of weight. Patient denies any excessive worrying or anxiety or significant depression. Patient denies any problems with insomnia. Patient denies excessive thirst, polyuria, polydipsia. Patient denies any swollen glands, patient denies easy bruising or easy bleeding. Patient denies any recent infections, allergies or URI. Patient "s visual fields have not changed significantly in recent time.   PHYSICAL EXAM: BP (!) 151/59   Pulse (!) 50   Resp 16   Ht 5\' 4"  (1.626 m)   Wt 164 lb 14.4 oz (74.8 kg)   BMI 28.31 kg/m  Patient has a significant surgical defect in the right lower lip.  No evidence of adenopathy in the submental or cervical chain is noted.  Well-developed well-nourished patient in NAD. HEENT reveals PERLA, EOMI, discs not visualized.  Oral cavity is clear. No oral mucosal lesions are identified. Neck is clear without evidence of cervical or supraclavicular adenopathy. Lungs are clear to A&P. Cardiac examination is essentially unremarkable with regular rate and rhythm without murmur  rub or thrill. Abdomen is benign with no organomegaly or masses noted. Motor sensory and DTR levels are equal and symmetric in the upper and lower extremities. Cranial nerves II through XII are grossly intact. Proprioception is intact. No peripheral adenopathy or edema is identified. No motor or sensory levels are noted. Crude visual fields are within normal range.  LABORATORY DATA: Pathology reports reviewed    RADIOLOGY RESULTS: CT scan of soft tissue head and neck's ordered will review prior to initiating treatment   IMPRESSION: Stage IIb well-differentiated squamous cell carcinoma of the right lower lip 1 status post Mohs chemosurgery with positive perineural invasion as well as potential for margin involvement in 72 year old female  PLAN: At this time I will review her CT scan of the head and neck to make sure were not dealing with any of the local advanced disease with involving the adenopathy of the head and neck.  Would plan on delivering 50 Gray over 5 weeks using electron-beam therapy to the area of surgical excitement.  Risks and benefits of treatment including skin reaction fatigue oral mucositis all were reviewed with the patient.  I am starting her on Carafate rinses for her sore gums.  I will I have personally set  up and ordered CT simulation in about 2 weeks time to allow for CT scan to be performed and read.  Patient comprehends her recommendations well.  I would like to take this opportunity to thank you for allowing me to participate in the care of your patient.Carmina Miller, MD

## 2023-04-04 ENCOUNTER — Other Ambulatory Visit: Payer: Self-pay | Admitting: *Deleted

## 2023-04-04 MED ORDER — SUCRALFATE 1 G PO TABS: 1.0000 g | ORAL_TABLET | Freq: Three times a day (TID) | ORAL | 1 refills | Status: DC

## 2023-04-07 ENCOUNTER — Other Ambulatory Visit: Payer: Self-pay | Admitting: Dermatology

## 2023-04-07 ENCOUNTER — Ambulatory Visit: Payer: BC Managed Care – PPO

## 2023-04-07 ENCOUNTER — Other Ambulatory Visit: Payer: BC Managed Care – PPO

## 2023-04-07 ENCOUNTER — Other Ambulatory Visit: Payer: Self-pay | Admitting: Radiation Oncology

## 2023-04-07 ENCOUNTER — Ambulatory Visit
Admission: RE | Admit: 2023-04-07 | Discharge: 2023-04-07 | Disposition: A | Discharge status: Home / Self Care | Payer: BC Managed Care – PPO | Source: Ambulatory Visit | Attending: Dermatology | Admitting: Dermatology

## 2023-04-07 ENCOUNTER — Other Ambulatory Visit: Payer: Self-pay

## 2023-04-07 DIAGNOSIS — C4492 Squamous cell carcinoma of skin, unspecified: Secondary | ICD-10-CM | POA: Insufficient documentation

## 2023-04-07 DIAGNOSIS — C4402 Squamous cell carcinoma of skin of lip: Secondary | ICD-10-CM

## 2023-04-07 DIAGNOSIS — C009 Malignant neoplasm of lip, unspecified: Secondary | ICD-10-CM | POA: Diagnosis not present

## 2023-04-07 DIAGNOSIS — M799 Soft tissue disorder, unspecified: Secondary | ICD-10-CM | POA: Diagnosis not present

## 2023-04-07 DIAGNOSIS — C4490 Unspecified malignant neoplasm of skin, unspecified: Secondary | ICD-10-CM | POA: Diagnosis not present

## 2023-04-07 DIAGNOSIS — I6529 Occlusion and stenosis of unspecified carotid artery: Secondary | ICD-10-CM | POA: Diagnosis not present

## 2023-04-07 MED ORDER — IOHEXOL 300 MG/ML  SOLN
75.0000 mL | Freq: Once | INTRAMUSCULAR | Status: AC | PRN
  Administered 2023-04-07: 75 mL via INTRAVENOUS

## 2023-04-08 ENCOUNTER — Ambulatory Visit: Payer: BC Managed Care – PPO

## 2023-04-09 ENCOUNTER — Encounter: Payer: Self-pay | Admitting: Dermatology

## 2023-04-09 ENCOUNTER — Ambulatory Visit: Payer: BC Managed Care – PPO | Admitting: Dermatology

## 2023-04-09 ENCOUNTER — Telehealth: Payer: Self-pay

## 2023-04-09 VITALS — BP 130/69 | HR 79

## 2023-04-09 DIAGNOSIS — Z85828 Personal history of other malignant neoplasm of skin: Secondary | ICD-10-CM

## 2023-04-09 DIAGNOSIS — L239 Allergic contact dermatitis, unspecified cause: Secondary | ICD-10-CM | POA: Diagnosis not present

## 2023-04-09 DIAGNOSIS — C4492 Squamous cell carcinoma of skin, unspecified: Secondary | ICD-10-CM

## 2023-04-09 DIAGNOSIS — T1490XD Injury, unspecified, subsequent encounter: Secondary | ICD-10-CM

## 2023-04-09 DIAGNOSIS — L259 Unspecified contact dermatitis, unspecified cause: Secondary | ICD-10-CM

## 2023-04-09 MED ORDER — TRIAMCINOLONE ACETONIDE 0.1 % EX OINT: 1.0000 | TOPICAL_OINTMENT | Freq: Every day | CUTANEOUS | 0 refills | Status: DC | PRN

## 2023-04-09 NOTE — Telephone Encounter (Signed)
L/m for pt that CT scan was clear but she needs skin checks every 6 mths

## 2023-04-09 NOTE — Patient Instructions (Signed)

## 2023-04-09 NOTE — Telephone Encounter (Signed)
-----   Message from [facility] PACI sent at 04/09/2023 10:05 AM EST ----- CT of head and neck was clear; no evidence of lymphnode spread for the [facility], which is good news. Patient should be seen for skin checks every 6 months.

## 2023-04-09 NOTE — Progress Notes (Signed)
   Follow-Up Visit   Subjective  Tammy Stevenson is a 72 y.o. female who presents for the following: Mohs follow up of right lower lip Pt here for follow up from Mohs to lip.   The following portions of the chart were reviewed this encounter and updated as appropriate: medications, allergies, medical history  Review of Systems:  No other skin or systemic complaints except as noted in HPI or Assessment and Plan.  Objective  Well appearing patient in no apparent distress; mood and affect are within normal limits.  A focused examination was performed of the following areas: Right lower lip  Relevant exam findings are noted in the Assessment and Plan.   Assessment & Plan   Wound check s/p Mohs Exam: well healing wound  Treatment Plan: Continue mupirocin daily as directed  Reassured that wound appears to be healing well  HISTORY OF SQUAMOUS CELL CARCINOMA OF THE LIP - No evidence of recurrence today - No lymphadenopathy - Recommend regular full body skin exams - Recommend daily broad spectrum sunscreen SPF 30+ to sun-exposed areas, reapply every 2 hours as needed.  - Call if any new or changing lesions are noted between office visits - CT scan of head and neck was negative and showed no evidence of metastasis or involvement of the lymphnodes - OK to discuss radiation with Radiation Oncology but up to patient and Rad Onc physician to decide, as CT was reassuring, although her BWH criteria could make her a candidate   ALLERGIC CONTACT DERMATITIS Exam: scaly pink papules and/or plaques +/- vesiculation  Chronic and persistent condition with duration or expected duration over one year. Condition is bothersome/symptomatic for patient. Currently flared.   Treatment Plan: Triamcinolone oint to affected area bid for 2 weeks   Return in about 4 weeks (around 05/07/2023) for wound check.  I, Tillie Fantasia, CMA, am acting as scribe for [name], MD.   Documentation: I have  reviewed the above documentation for accuracy and completeness, and I agree with the above.  [name], MD

## 2023-04-15 ENCOUNTER — Ambulatory Visit: Admission: RE | Admit: 2023-04-15 | Payer: BC Managed Care – PPO | Source: Ambulatory Visit

## 2023-04-15 ENCOUNTER — Ambulatory Visit
Admission: RE | Admit: 2023-04-15 | Discharge: 2023-04-15 | Disposition: A | Discharge status: Home / Self Care | Payer: BC Managed Care – PPO | Source: Ambulatory Visit | Attending: Radiation Oncology | Admitting: Radiation Oncology

## 2023-04-15 DIAGNOSIS — C4402 Squamous cell carcinoma of skin of lip: Secondary | ICD-10-CM

## 2023-04-24 ENCOUNTER — Ambulatory Visit: Payer: BC Managed Care – PPO

## 2023-04-25 ENCOUNTER — Ambulatory Visit: Payer: BC Managed Care – PPO

## 2023-04-28 ENCOUNTER — Ambulatory Visit: Payer: BC Managed Care – PPO

## 2023-04-29 ENCOUNTER — Ambulatory Visit: Payer: BC Managed Care – PPO

## 2023-04-30 ENCOUNTER — Ambulatory Visit: Payer: BC Managed Care – PPO

## 2023-05-01 ENCOUNTER — Ambulatory Visit: Payer: BC Managed Care – PPO

## 2023-05-02 ENCOUNTER — Ambulatory Visit: Payer: BC Managed Care – PPO

## 2023-05-05 ENCOUNTER — Ambulatory Visit: Payer: BC Managed Care – PPO

## 2023-05-06 ENCOUNTER — Ambulatory Visit: Payer: BC Managed Care – PPO

## 2023-05-07 ENCOUNTER — Ambulatory Visit: Payer: BC Managed Care – PPO

## 2023-05-07 ENCOUNTER — Encounter: Payer: Self-pay | Admitting: Dermatology

## 2023-05-07 ENCOUNTER — Ambulatory Visit: Payer: BC Managed Care – PPO | Admitting: Dermatology

## 2023-05-07 VITALS — BP 132/76 | HR 74

## 2023-05-07 DIAGNOSIS — Z85828 Personal history of other malignant neoplasm of skin: Secondary | ICD-10-CM

## 2023-05-07 DIAGNOSIS — C4492 Squamous cell carcinoma of skin, unspecified: Secondary | ICD-10-CM

## 2023-05-07 DIAGNOSIS — L905 Scar conditions and fibrosis of skin: Secondary | ICD-10-CM | POA: Diagnosis not present

## 2023-05-07 NOTE — Patient Instructions (Signed)

## 2023-05-07 NOTE — Progress Notes (Signed)
   Follow-Up Visit   Subjective  Tammy Stevenson is a 72 y.o. female who presents for the following: Mohs of right lower mucosal lip Pt here to f/u from Mohs on 03/26/23 for a SCC on the right lower mucosal lip She states that it sometimes feel like it blisters and it still gets numb.  The following portions of the chart were reviewed this encounter and updated as appropriate: medications, allergies, medical history  Review of Systems:  No other skin or systemic complaints except as noted in HPI or Assessment and Plan.  Objective  Well appearing patient in no apparent distress; mood and affect are within normal limits.  A focused examination was performed of the following areas: Right lower mucosal lip  Relevant exam findings are noted in the Assessment and Plan.    Assessment & Plan   Scar s/p Mohs for SCC on the right lower mucosal lip, treated on 03/26/2023, healed by secondary intention - Reassured that wound has healed well - Discussed that scars take up to 12 months to mature from the date of surgery - Recommend SPF 30+ to scar daily to prevent purple color - OK to start scar massage at 4-6 weeks post-op - Can consider silicone based products for scar healing    Return in about 6 months (around 11/05/2023) for TBSE.  I, Tillie Fantasia, CMA, am acting as scribe for [name], MD.   Documentation: I have reviewed the above documentation for accuracy and completeness, and I agree with the above.  [name], MD

## 2023-05-08 ENCOUNTER — Ambulatory Visit: Payer: BC Managed Care – PPO

## 2023-05-09 ENCOUNTER — Ambulatory Visit: Payer: BC Managed Care – PPO

## 2023-05-12 ENCOUNTER — Ambulatory Visit: Payer: BC Managed Care – PPO | Admitting: Family Medicine

## 2023-05-12 ENCOUNTER — Encounter: Payer: Self-pay | Admitting: Family Medicine

## 2023-05-12 ENCOUNTER — Ambulatory Visit: Payer: BC Managed Care – PPO

## 2023-05-12 VITALS — BP 175/58 | HR 72 | Temp 97.9°F | Ht 64.0 in | Wt 163.0 lb

## 2023-05-12 DIAGNOSIS — B351 Tinea unguium: Secondary | ICD-10-CM

## 2023-05-12 DIAGNOSIS — E1159 Type 2 diabetes mellitus with other circulatory complications: Secondary | ICD-10-CM | POA: Diagnosis not present

## 2023-05-12 DIAGNOSIS — Z09 Encounter for follow-up examination after completed treatment for conditions other than malignant neoplasm: Secondary | ICD-10-CM | POA: Diagnosis not present

## 2023-05-12 DIAGNOSIS — Z1231 Encounter for screening mammogram for malignant neoplasm of breast: Secondary | ICD-10-CM

## 2023-05-12 DIAGNOSIS — J449 Chronic obstructive pulmonary disease, unspecified: Secondary | ICD-10-CM

## 2023-05-12 DIAGNOSIS — I152 Hypertension secondary to endocrine disorders: Secondary | ICD-10-CM | POA: Diagnosis not present

## 2023-05-12 DIAGNOSIS — E1169 Type 2 diabetes mellitus with other specified complication: Secondary | ICD-10-CM

## 2023-05-12 DIAGNOSIS — Z13820 Encounter for screening for osteoporosis: Secondary | ICD-10-CM

## 2023-05-12 DIAGNOSIS — N182 Chronic kidney disease, stage 2 (mild): Secondary | ICD-10-CM

## 2023-05-12 DIAGNOSIS — Z780 Asymptomatic menopausal state: Secondary | ICD-10-CM

## 2023-05-12 DIAGNOSIS — E1165 Type 2 diabetes mellitus with hyperglycemia: Secondary | ICD-10-CM | POA: Diagnosis not present

## 2023-05-12 DIAGNOSIS — E785 Hyperlipidemia, unspecified: Secondary | ICD-10-CM

## 2023-05-12 MED ORDER — ROSUVASTATIN CALCIUM 10 MG PO TABS: 10.0000 mg | ORAL_TABLET | Freq: Every day | ORAL | 3 refills | Status: DC

## 2023-05-12 MED ORDER — ALBUTEROL SULFATE HFA 108 (90 BASE) MCG/ACT IN AERS: 2.0000 | INHALATION_SPRAY | Freq: Four times a day (QID) | RESPIRATORY_TRACT | 0 refills | Status: DC | PRN

## 2023-05-12 MED ORDER — LOSARTAN POTASSIUM 100 MG PO TABS: 100.0000 mg | ORAL_TABLET | Freq: Every day | ORAL | 1 refills | Status: DC

## 2023-05-12 MED ORDER — METFORMIN HCL ER 500 MG PO TB24: 1000.0000 mg | ORAL_TABLET | Freq: Two times a day (BID) | ORAL | 1 refills | Status: DC

## 2023-05-12 MED ORDER — HYDROCHLOROTHIAZIDE 12.5 MG PO TABS: 12.5000 mg | ORAL_TABLET | Freq: Every day | ORAL | 3 refills | Status: DC

## 2023-05-12 NOTE — Assessment & Plan Note (Signed)
Diabetes mellitus type 2, managed with metformin. No issues with metformin. No signs of neuropathy or foot ulcers. Foot exam normal. - Continue metformin 500 mg BID - Refer to podiatry for diabetic foot care - Educate on foot hygiene and infection prevention

## 2023-05-12 NOTE — Assessment & Plan Note (Signed)
Hypertension, managed with losartan. Home BP readings inconsistent. Leg swelling noted, likely related to hypertension. Discussed adding hydrochlorothiazide for BP control and edema. - Continue losartan - Add hydrochlorothiazide - Monitor BP at home - Follow up in six weeks for BP recheck

## 2023-05-12 NOTE — Assessment & Plan Note (Signed)
Chronic kidney disease stage 2, likely secondary to diabetes and hypertension. Slightly reduced kidney function, no immediate management changes needed. - Monitor kidney function - Obtain urine sample for kidney function assessment

## 2023-05-12 NOTE — Assessment & Plan Note (Signed)
Patient stopped seeing podiatry after COVID started.  Will refer patient back to podiatry for specialist follow-up and management.

## 2023-05-12 NOTE — Assessment & Plan Note (Signed)
Hyperlipidemia, managed with Welchol.  I suspect the patient has not been taking/receiving her rosuvastatin as a 60 days worth was last ordered in January.  Total cholesterol remains elevated, likely due to diabetes. Emphasized importance of medication adherence. - Restart rosuvastatin - Continue Welchol

## 2023-05-12 NOTE — Patient Instructions (Addendum)
Please call the [facility] ([phone]) to schedule a routine screening mammogram.  

## 2023-05-12 NOTE — Assessment & Plan Note (Signed)
Asthma, managed with albuterol and Symbicort. Symbicort not used regularly. Emphasized regular use for effective symptom management. - Continue albuterol PRN - Ensure regular use of Symbicort - Send albuterol refill

## 2023-05-12 NOTE — Progress Notes (Signed)
Established patient visit   Patient: Tammy Stevenson   DOB: June 25, 1950   72 y.o. Female  MRN: [id number] Visit Date: 05/12/2023  Today's healthcare provider: Sherlyn Hay, DO   Chief Complaint  Patient presents with   Hypertension   Diabetes    Patient is due for diabetic foot exam   Hyperlipidemia    Patient has not been taking her statin medication since her cancer surgery.  They were under the impression she should only be on the [facility].  Please clarify if she should go back on it.    Subjective    HPI The patient, with a history of diabetes, hypertension, and hyperlipidemia, presents for a routine follow-up. She reports adhering to her prescribed medication regimen, including: WelChol, metformin, and losartan. However, she has discontinued the use of sucralfate, triamcinolone ointment, and oxycodone, which were prescribed following a recent surgical procedure.  She is unclear if she is taking rosuvastatin or not.  The patient reports no current issues with her diabetes medication, metformin, and denies any associated gastrointestinal side effects. She also denies any current respiratory distress and uses her inhaler as needed for cough, though she is unsure if it is albuterol or Symbicort. The patient has a history of tobacco use but has quit both smoking and chewing tobacco.  The patient denies any numbness or tingling in her feet. On examination, her sensation in the feet was intact. She has not had any open wounds or ulcers on her feet. The patient has some swelling in her legs, but denies any pain in her calves when walking.  She has been checking her blood pressure at home intermittently, but could not provide recent readings.  The patient has not had any recent imaging or screening tests, including a mammogram or bone density scan, which were ordered earlier in the year. She has not seen a podiatrist recently but expressed interest in doing  so.     Medications: Outpatient Medications Prior to Visit  Medication Sig   budesonide-formoterol (SYMBICORT) 80-4.5 MCG/ACT inhaler Inhale 2 puffs into the lungs 2 (two) times daily.   colesevelam (WELCHOL) 625 MG tablet Take 3 tablets (1,875 mg total) by mouth 2 (two) times daily with a meal.   [DISCONTINUED] albuterol (PROAIR HFA) 108 (90 Base) MCG/ACT inhaler Inhale 2 puffs into the lungs every 6 (six) hours as needed for wheezing or shortness of breath.   [DISCONTINUED] losartan (COZAAR) 100 MG tablet Take 1 tablet (100 mg total) by mouth daily.   [DISCONTINUED] metFORMIN (GLUCOPHAGE-XR) 500 MG 24 hr tablet Take 2 tablets (1,000 mg total) by mouth 2 (two) times daily with a meal.   [DISCONTINUED] mupirocin ointment (BACTROBAN) 2 % Apply 1 Application topically 2 (two) times daily. To wound   [DISCONTINUED] oxycodone (OXY-IR) 5 MG capsule Take 1 capsule (5 mg total) by mouth every 4 (four) hours as needed. (Patient not taking: Reported on 05/12/2023)   [DISCONTINUED] rosuvastatin (CRESTOR) 40 MG tablet Take 1 tablet (40 mg total) by mouth daily.   [DISCONTINUED] sucralfate (CARAFATE) 1 g tablet Take 1 tablet (1 g total) by mouth 4 (four) times daily -  with meals and at bedtime. Dissolve tablet in 4 T of warm water swish and swallow 30 minutes before meals. (Patient not taking: Reported on 05/12/2023)   [DISCONTINUED] triamcinolone ointment (KENALOG) 0.1 % Apply 1 Application topically daily as needed. (Patient not taking: Reported on 05/12/2023)   No facility-administered medications prior to visit.  Objective    BP (!) 175/58 (BP Location: Right Arm, Patient Position: Sitting, Cuff Size: Normal)   Pulse 72   Temp 97.9 F (36.6 C) (Oral)   Ht 5\' 4"  (1.626 m)   Wt 163 lb (73.9 kg)   SpO2 96%   BMI 27.98 kg/m     Physical Exam Constitutional:      Appearance: Normal appearance.  HENT:     Head: Normocephalic and atraumatic.  Eyes:     General: No scleral  icterus.    Extraocular Movements: Extraocular movements intact.     Conjunctiva/sclera: Conjunctivae normal.  Cardiovascular:     Rate and Rhythm: Normal rate and regular rhythm.     Pulses: Normal pulses.          Dorsalis pedis pulses are 2+ on the right side and 2+ on the left side.       Posterior tibial pulses are 2+ on the right side and 2+ on the left side.     Heart sounds: Normal heart sounds.  Pulmonary:     Effort: Pulmonary effort is normal. No respiratory distress.     Breath sounds: Normal breath sounds.  Abdominal:     General: Bowel sounds are normal.     Palpations: Abdomen is soft.  Musculoskeletal:     Right lower leg: Edema (trace) present.     Left lower leg: Edema (trace) present.     Right foot: Normal range of motion. No deformity, bunion, Charcot foot, foot drop or prominent metatarsal heads.     Left foot: Normal range of motion. No deformity, bunion, Charcot foot, foot drop or prominent metatarsal heads.  Feet:     Right foot:     Protective Sensation: 10 sites tested.  10 sites sensed.     Skin integrity: No ulcer, blister, skin breakdown, erythema, warmth, callus, dry skin or fissure.     Toenail Condition: Right toenails are normal.     Left foot:     Protective Sensation: 10 sites tested.  10 sites sensed.     Skin integrity: No ulcer, blister, skin breakdown, erythema, warmth, callus, dry skin or fissure.     Toenail Condition: Left toenails are normal.  Skin:    General: Skin is warm and dry.  Neurological:     Mental Status: She is alert and oriented to person, place, and time. Mental status is at baseline.  Psychiatric:        Mood and Affect: Mood normal.        Behavior: Behavior normal.      No results found for any visits on 05/12/23.  Assessment & Plan    Follow-up exam, less than 3 months since previous exam  Hypertension associated with diabetes [facility]) Assessment & Plan: Hypertension, managed with losartan. Home BP readings  inconsistent. Leg swelling noted, likely related to hypertension. Discussed adding hydrochlorothiazide for BP control and edema. - Continue losartan - Add hydrochlorothiazide - Monitor BP at home - Follow up in six weeks for BP recheck   Orders: -     Microalbumin / creatinine urine ratio -     Losartan Potassium; Take 1 tablet (100 mg total) by mouth daily.  Dispense: 90 tablet; Refill: 1 -     hydroCHLOROthiazide; Take 1 tablet (12.5 mg total) by mouth daily.  Dispense: 90 tablet; Refill: 3  Hyperlipidemia associated with type 2 diabetes mellitus (HCC) Assessment & Plan: Hyperlipidemia, managed with Welchol.  I suspect the patient has not been  taking/receiving her rosuvastatin as a 60 days worth was last ordered in January.  Total cholesterol remains elevated, likely due to diabetes. Emphasized importance of medication adherence. - Restart rosuvastatin - Continue Welchol  Orders: -     Rosuvastatin Calcium; Take 1 tablet (10 mg total) by mouth daily.  Dispense: 90 tablet; Refill: 3  Uncontrolled type 2 diabetes mellitus with hyperglycemia (HCC) Assessment & Plan: Diabetes mellitus type 2, managed with metformin. No issues with metformin. No signs of neuropathy or foot ulcers. Foot exam normal. - Continue metformin 500 mg BID - Refer to podiatry for diabetic foot care - Educate on foot hygiene and infection prevention  Orders: -     Microalbumin / creatinine urine ratio -     metFORMIN HCl ER; Take 2 tablets (1,000 mg total) by mouth 2 (two) times daily with a meal.  Dispense: 120 tablet; Refill: 1 -     Ambulatory referral to Podiatry  Chronic kidney disease, stage 2, mildly decreased GFR Assessment & Plan: Chronic kidney disease stage 2, likely secondary to diabetes and hypertension. Slightly reduced kidney function, no immediate management changes needed. - Monitor kidney function - Obtain urine sample for kidney function assessment   Encounter for osteoporosis screening in  asymptomatic postmenopausal patient -     DG [facility] DEXA  Encounter for screening mammogram for malignant neoplasm of breast -     3D Screening Mammogram, Left and Right; Future  Chronic obstructive pulmonary disease, unspecified COPD type (HCC) Assessment & Plan: Asthma, managed with albuterol and Symbicort. Symbicort not used regularly. Emphasized regular use for effective symptom management. - Continue albuterol PRN - Ensure regular use of Symbicort - Send albuterol refill  Orders: -     Albuterol Sulfate HFA; Inhale 2 puffs into the lungs every 6 (six) hours as needed for wheezing or shortness of breath.  Dispense: 13.4 g; Refill: 0  Onychomycosis Assessment & Plan: Patient stopped seeing podiatry after COVID started.  Will refer patient back to podiatry for specialist follow-up and management.  Orders: -     Ambulatory referral to Podiatry  General Health Maintenance - Reorder mammogram and DEXA scan   Return in about 6 weeks (around 06/23/2023) for HTN.      I discussed the assessment and treatment plan with the patient  The patient was provided an opportunity to ask questions and all were answered. The patient agreed with the plan and demonstrated an understanding of the instructions.   The patient was advised to call back or seek an in-person evaluation if the symptoms worsen or if the condition fails to improve as anticipated.  Total time was 40 minutes. That includes chart review before the visit, the actual patient visit, and time spent on documentation after the visit.    Sherlyn Hay, DO  [facility] [phone] (phone) [phone] (fax)  [facility]

## 2023-05-13 LAB — MICROALBUMIN / CREATININE URINE RATIO
Creatinine, Urine: 43.8 mg/dL
Microalb/Creat Ratio: <7 mg/g{creat} (ref 0–29)
Microalbumin, Urine: <3 ug/mL

## 2023-05-13 LAB — SPECIMEN STATUS REPORT

## 2023-05-15 ENCOUNTER — Ambulatory Visit: Payer: BC Managed Care – PPO

## 2023-05-16 ENCOUNTER — Ambulatory Visit: Payer: BC Managed Care – PPO

## 2023-05-19 ENCOUNTER — Encounter: Payer: Self-pay | Admitting: *Deleted

## 2023-05-19 ENCOUNTER — Ambulatory Visit: Payer: BC Managed Care – PPO

## 2023-05-20 ENCOUNTER — Ambulatory Visit: Payer: BC Managed Care – PPO

## 2023-05-22 ENCOUNTER — Ambulatory Visit: Payer: BC Managed Care – PPO

## 2023-05-23 ENCOUNTER — Ambulatory Visit: Payer: BC Managed Care – PPO

## 2023-05-26 ENCOUNTER — Ambulatory Visit: Payer: BC Managed Care – PPO

## 2023-05-27 ENCOUNTER — Ambulatory Visit: Payer: BC Managed Care – PPO

## 2023-05-28 ENCOUNTER — Ambulatory Visit: Payer: BC Managed Care – PPO

## 2023-05-29 ENCOUNTER — Other Ambulatory Visit: Payer: Self-pay | Admitting: Family Medicine

## 2023-05-29 ENCOUNTER — Ambulatory Visit: Payer: BC Managed Care – PPO

## 2023-05-30 ENCOUNTER — Ambulatory Visit: Payer: BC Managed Care – PPO

## 2023-06-02 NOTE — Telephone Encounter (Signed)
 Requested Prescriptions  Pending Prescriptions Disp Refills   colesevelam  (WELCHOL ) 625 MG tablet [Pharmacy Med Name: COLESEVELAM  HCL TABS 625MG ] 540 tablet 0    Sig: TAKE 3 TABLETS TWICE A DAY WITH MEALS     Cardiovascular:  Antilipid - Bile Acid Sequestrants Failed - 06/02/2023 10:47 AM      Failed - Lipid Panel in normal range within the last 12 months    Cholesterol, Total  Date Value Ref Range Status  02/28/2023 179 100 - 199 mg/dL Final   LDL Chol Calc (NIH)  Date Value Ref Range Status  02/28/2023 98 0 - 99 mg/dL Final   HDL  Date Value Ref Range Status  02/28/2023 57 >39 mg/dL Final   Triglycerides  Date Value Ref Range Status  02/28/2023 135 0 - 149 mg/dL Final         Passed - Valid encounter within last 12 months    Recent Outpatient Visits           3 weeks ago Follow-up exam, less than 3 months since previous exam   [facility] Pardue, Sarah N, DO   3 months ago Hypertension associated with diabetes [facility])   [facility] Pardue, Lauraine SAILOR, DO   3 months ago Hypertension associated with diabetes [facility])   [facility] [place], Jon HERO, MD   1 year ago Acute exacerbation of chronic obstructive pulmonary disease (COPD) [facility])   [facility] Emilio Kelly DASEN, FNP   2 years ago    [facility] Emilio Kelly DASEN, FNP       Future Appointments             In 3 weeks Pardue, Lauraine SAILOR, DO [place] [facility]

## 2023-06-04 ENCOUNTER — Other Ambulatory Visit: Payer: Self-pay | Admitting: Family Medicine

## 2023-06-04 DIAGNOSIS — E1165 Type 2 diabetes mellitus with hyperglycemia: Secondary | ICD-10-CM

## 2023-06-04 DIAGNOSIS — J441 Chronic obstructive pulmonary disease with (acute) exacerbation: Secondary | ICD-10-CM

## 2023-06-04 NOTE — Telephone Encounter (Signed)
 Medication Refill -  Most Recent Primary Care Visit:  Provider: Carlean Charter  Department: [facility]  Visit Type: OFFICE VISIT  Date: 05/12/2023  Medication: metFORMIN  (GLUCOPHAGE -XR) 500 MG 24 hr tablet  budesonide -formoterol  (SYMBICORT ) 80-4.5 MCG/ACT inhaler   Daughter attempted to get refill for [facility]  [facility]. Daughter informed to reach out to office to request 30 day supply to go to local pharmacy until pt is seen at visit in Feb 2025. Daughter requesting 30 day supply to be sent to pharmacy below.  Daughter calling to inform Dr. Athena Bland patient does not have the Symbicort  inhaler and has been using the albuterol  only. Daughter states it shows that patient was taking it before but does not currently have it. Pt has never received [facility] from [facility]. Requesting refill be sent to pharmacy below.   Has the patient contacted their pharmacy? Yes To contact office.   Is this the correct pharmacy for this prescription? Yes This is the patient's preferred pharmacy:   [facility] - [place], [place] - [address] [place] [address] Phone: [phone] Fax: [phone]   Has the prescription been filled recently? No  Is the patient out of the medication? Yes  Has the patient been seen for an appointment in the last year OR does the patient have an upcoming appointment? Yes  Can we respond through MyChart? Yes  Agent: Please be advised that Rx refills may take up to 3 business days. We ask that you follow-up with your pharmacy.

## 2023-06-04 NOTE — Telephone Encounter (Signed)
 Requesting a short supply sent to [facility] pharmacy.

## 2023-06-05 MED ORDER — BUDESONIDE-FORMOTEROL FUMARATE 80-4.5 MCG/ACT IN AERO: 2.0000 | INHALATION_SPRAY | Freq: Two times a day (BID) | RESPIRATORY_TRACT | 0 refills | Status: DC

## 2023-06-05 MED ORDER — METFORMIN HCL ER 500 MG PO TB24: 1000.0000 mg | ORAL_TABLET | Freq: Two times a day (BID) | ORAL | 0 refills | Status: DC

## 2023-06-05 NOTE — Telephone Encounter (Signed)
30 day supply given for both rx to last until appt on 06/26/2023.  Requested Prescriptions  Pending Prescriptions Disp Refills   metFORMIN (GLUCOPHAGE-XR) 500 MG 24 hr tablet 120 tablet 0    Sig: Take 2 tablets (1,000 mg total) by mouth 2 (two) times daily with a meal.     Endocrinology:  Diabetes - Biguanides Failed - 06/05/2023 11:04 AM      Failed - B12 Level in normal range and within 720 days    No results found for: "VITAMINB12"       Failed - CBC within normal limits and completed in the last 12 months    WBC  Date Value Ref Range Status  05/27/2022 9.1 3.4 - 10.8 x10E3/uL Final   RBC  Date Value Ref Range Status  05/27/2022 4.65 3.77 - 5.28 x10E6/uL Final   Hemoglobin  Date Value Ref Range Status  05/27/2022 12.9 11.1 - 15.9 g/dL Final   Hematocrit  Date Value Ref Range Status  05/27/2022 38.6 34.0 - 46.6 % Final   MCHC  Date Value Ref Range Status  05/27/2022 33.4 31.5 - 35.7 g/dL Final   [facility]  Date Value Ref Range Status  05/27/2022 27.7 26.6 - 33.0 pg Final   MCV  Date Value Ref Range Status  05/27/2022 83 79 - 97 fL Final   No results found for: "PLTCOUNTKUC", "LABPLAT", "POCPLA" RDW  Date Value Ref Range Status  05/27/2022 12.5 11.7 - 15.4 % Final         Passed - Cr in normal range and within 360 days    Creatinine, Ser  Date Value Ref Range Status  02/28/2023 0.79 0.57 - 1.00 mg/dL Final   Creatinine, POC  Date Value Ref Range Status  07/16/2016 NA mg/dL Final         Passed - HBA1C is between 0 and 7.9 and within 180 days    Hemoglobin A1C  Date Value Ref Range Status  04/21/2014 7.2  Final   Hgb A1c MFr Bld  Date Value Ref Range Status  02/28/2023 7.0 (H) 4.8 - 5.6 % Final    Comment:             Prediabetes: 5.7 - 6.4          Diabetes: >6.4          Glycemic control for adults with diabetes: <7.0          Passed - eGFR in normal range and within 360 days    GFR calc Af Amer  Date Value Ref Range Status  09/06/2019 79 >59  mL/min/1.73 Final   GFR calc non Af Amer  Date Value Ref Range Status  09/06/2019 68 >59 mL/min/1.73 Final   eGFR  Date Value Ref Range Status  02/28/2023 80 >59 mL/min/1.73 Final         Passed - Valid encounter within last 6 months    Recent Outpatient Visits           3 weeks ago Follow-up exam, less than 3 months since previous exam   [facility] Pardue, Sarah N, DO   3 months ago Hypertension associated with diabetes [facility])   [facility] Pardue, Monico Blitz, DO   4 months ago Hypertension associated with diabetes [facility])   [place] [facility] [place], Marzella Schlein, MD   1 year ago Acute exacerbation of chronic obstructive pulmonary disease (COPD) (HCC)   [place] [facility]  Jacky Kindle, FNP   2 years ago    [facility] Merita Norton T, FNP       Future Appointments             In 2 weeks Pardue, Monico Blitz, DO [facility]             budesonide-formoterol [facility]) 80-4.5 MCG/ACT inhaler 1 each 0    Sig: Inhale 2 puffs into the lungs 2 (two) times daily.     Pulmonology:  Combination Products Passed - 06/05/2023 11:04 AM      Passed - Valid encounter within last 12 months    Recent Outpatient Visits           3 weeks ago Follow-up exam, less than 3 months since previous exam   [facility] Pardue, Sarah N, DO   3 months ago Hypertension associated with diabetes [facility])   [facility] Pardue, Monico Blitz, DO   4 months ago Hypertension associated with diabetes [facility])   [facility] [place], Marzella Schlein, MD   1 year ago Acute exacerbation of chronic obstructive pulmonary disease (COPD) [facility])   [facility] Jacky Kindle, FNP   2 years ago    [facility] Jacky Kindle, FNP       Future  Appointments             In 2 weeks Pardue, Monico Blitz, DO  [facility]

## 2023-06-10 ENCOUNTER — Encounter: Payer: Self-pay | Admitting: Family Medicine

## 2023-06-10 ENCOUNTER — Ambulatory Visit: Payer: BC Managed Care – PPO | Admitting: Family Medicine

## 2023-06-10 ENCOUNTER — Ambulatory Visit: Payer: Self-pay

## 2023-06-10 VITALS — BP 165/71 | HR 69 | Ht 64.0 in | Wt 167.9 lb

## 2023-06-10 DIAGNOSIS — I152 Hypertension secondary to endocrine disorders: Secondary | ICD-10-CM

## 2023-06-10 DIAGNOSIS — E1159 Type 2 diabetes mellitus with other circulatory complications: Secondary | ICD-10-CM

## 2023-06-10 DIAGNOSIS — Z7984 Long term (current) use of oral hypoglycemic drugs: Secondary | ICD-10-CM | POA: Diagnosis not present

## 2023-06-10 DIAGNOSIS — J441 Chronic obstructive pulmonary disease with (acute) exacerbation: Secondary | ICD-10-CM | POA: Diagnosis not present

## 2023-06-10 MED ORDER — PREDNISONE 20 MG PO TABS: ORAL_TABLET | ORAL | 0 refills | Status: DC

## 2023-06-10 MED ORDER — HYDROCOD POLI-CHLORPHE POLI ER 10-8 MG/5ML PO SUER: 5.0000 mL | Freq: Two times a day (BID) | ORAL | 0 refills | Status: DC | PRN

## 2023-06-10 NOTE — Assessment & Plan Note (Signed)
Blood pressure 165 mmHg, likely elevated due to illness and coughing. No immediate intervention planned. - Follow up at scheduled visit 06/23/23 to reassess blood pressure

## 2023-06-10 NOTE — Assessment & Plan Note (Addendum)
Mild exacerbation with chronic cough. No shortness of breath or significant sputum production. Symbicort causing nausea, not used regularly. Discussed alternative inhaler options if symptoms persist or nausea continues.  Persistent cough for two weeks, described as feeling stuck in the throat. No fever, chills, nausea, vomiting, congestion, or phlegm. Similar symptoms last year treated with amoxicillin and prednisone. Lungs clear, no signs of pneumonia. Likely postnasal drip or mild COPD exacerbation. Antibiotics not appropriate due to lack of infection signs. COPD exacerbations typically do not require antibiotics unless significant sputum production, change in sputum color, or shortness of breath. - Prescribe prednisone - Prescribe Tussionex - Advise albuterol as needed - Continue Symbicort with mouth rinsing  - Instruct to rinse mouth after using Symbicort - Monitor symptoms; consider antibiotics if condition worsens or if increased sputum production, change in sputum color, or shortness of breath

## 2023-06-10 NOTE — Progress Notes (Signed)
Established patient visit   Patient: Tammy Stevenson   DOB: 10-15-1950   73 y.o. Female  MRN: [id number] Visit Date: 06/10/2023  Today's healthcare provider: Sherlyn Hay, DO   Chief Complaint  Patient presents with   Cough    Dry cough started about 2 wks ago No flem when coughing. Patient stated it is in her throat but wont cough it out   Subjective    Cough Pertinent negatives include no chest pain, chills, fever or shortness of breath.  The patient, with a history of COPD, presents with a persistent cough of approximately two weeks duration. She describes the cough as feeling 'stuck' in her throat, causing frequent nocturnal awakenings. The cough is non-productive and is not associated with dyspnea, fever, chills, nausea, or vomiting. The patient denies any congestion or ear and throat pain. She reports a similar episode of cough last year, which was treated with amoxicillin and prednisone.  The patient has been using Symbicort and albuterol inhalers intermittently, but reports no improvement in symptoms. She also reports that the Symbicort inhaler causes nausea. Over-the-counter remedies such as cough drops and Robitussin DM Max have been tried without relief. The patient has a history of azithromycin allergy, presenting as hives and rash.  The patient also has a history of hypertension, which tends to worsen during episodes of illness. She has not yet scheduled a due eye exam and is due for a shingles vaccine, which has been postponed due to the current illness.     Medications: Outpatient Medications Prior to Visit  Medication Sig   albuterol (PROAIR HFA) 108 (90 Base) MCG/ACT inhaler Inhale 2 puffs into the lungs every 6 (six) hours as needed for wheezing or shortness of breath.   budesonide-formoterol (SYMBICORT) 80-4.5 MCG/ACT inhaler Inhale 2 puffs into the lungs 2 (two) times daily.   colesevelam (WELCHOL) 625 MG tablet TAKE 3 TABLETS TWICE A DAY WITH MEALS    hydrochlorothiazide (HYDRODIURIL) 12.5 MG tablet Take 1 tablet (12.5 mg total) by mouth daily.   losartan (COZAAR) 100 MG tablet Take 1 tablet (100 mg total) by mouth daily.   metFORMIN (GLUCOPHAGE-XR) 500 MG 24 hr tablet Take 2 tablets (1,000 mg total) by mouth 2 (two) times daily with a meal.   rosuvastatin (CRESTOR) 10 MG tablet Take 1 tablet (10 mg total) by mouth daily.   No facility-administered medications prior to visit.    Review of Systems  Constitutional:  Negative for appetite change, chills, fatigue and fever.  Respiratory:  Positive for cough. Negative for chest tightness and shortness of breath.   Cardiovascular:  Negative for chest pain and palpitations.  Gastrointestinal:  Negative for abdominal pain, nausea and vomiting.  Neurological:  Negative for dizziness and weakness.        Objective    BP (!) 165/71 (BP Location: Left Arm, Patient Position: Sitting, Cuff Size: Normal)   Pulse 69   Ht 5\' 4"  (1.626 m)   Wt 167 lb 14.4 oz (76.2 kg)   SpO2 97%   BMI 28.82 kg/m     Physical Exam Vitals reviewed.  Constitutional:      General: She is not in acute distress.    Appearance: Normal appearance. She is well-developed. She is not diaphoretic.  HENT:     Head: Normocephalic and atraumatic.     Right Ear: Ear canal and external ear normal. Tympanic membrane is scarred.     Left Ear: Ear canal and external ear normal. Tympanic  membrane is scarred.     Ears:     Comments: Hearing aids in place bilaterally (removed for exam)    Nose: Nose normal.     Mouth/Throat:     Mouth: Mucous membranes are moist.     Pharynx: Oropharynx is clear. No oropharyngeal exudate.  Eyes:     General: No scleral icterus.    Conjunctiva/sclera: Conjunctivae normal.     Pupils: Pupils are equal, round, and reactive to light.  Cardiovascular:     Rate and Rhythm: Normal rate and regular rhythm.     Pulses: Normal pulses.     Heart sounds: Normal heart sounds. No murmur  heard. Pulmonary:     Effort: Pulmonary effort is normal. No respiratory distress.     Breath sounds: Normal breath sounds. No wheezing or rales.  Musculoskeletal:     Cervical back: Neck supple.     Right lower leg: No edema.     Left lower leg: No edema.  Lymphadenopathy:     Cervical: No cervical adenopathy.  Skin:    General: Skin is warm and dry.     Findings: No rash.  Neurological:     Mental Status: She is alert.      No results found for any visits on 06/10/23.  Assessment & Plan    Chronic obstructive pulmonary disease with acute exacerbation (HCC) Assessment & Plan: Mild exacerbation with chronic cough. No shortness of breath or significant sputum production. Symbicort causing nausea, not used regularly. Discussed alternative inhaler options if symptoms persist or nausea continues.  Persistent cough for two weeks, described as feeling stuck in the throat. No fever, chills, nausea, vomiting, congestion, or phlegm. Similar symptoms last year treated with amoxicillin and prednisone. Lungs clear, no signs of pneumonia. Likely postnasal drip or mild COPD exacerbation. Antibiotics not appropriate due to lack of infection signs. COPD exacerbations typically do not require antibiotics unless significant sputum production, change in sputum color, or shortness of breath. - Prescribe prednisone - Prescribe Tussionex - Advise albuterol as needed - Continue Symbicort with mouth rinsing  - Instruct to rinse mouth after using Symbicort - Monitor symptoms; consider antibiotics if condition worsens or if increased sputum production, change in sputum color, or shortness of breath  Orders: -     predniSONE; Take 60mg  PO daily x 2 days, then40mg  PO daily x 2 days, then 20mg  PO daily x 3 days  Dispense: 13 tablet; Refill: 0 -     Hydrocod Poli-Chlorphe Poli ER; Take 5 mLs by mouth every 12 (twelve) hours as needed for cough.  Dispense: 70 mL; Refill: 0  Hypertension associated with  diabetes (HCC) Assessment & Plan: Blood pressure 165 mmHg, likely elevated due to illness and coughing. No immediate intervention planned. - Follow up at scheduled visit 06/23/23 to reassess blood pressure   General Health Maintenance Due for shingles vaccine, deferred due to current illness. Eye exam not yet scheduled. - Schedule shingles vaccine post-recovery - Schedule eye exam   Return if symptoms worsen or fail to improve.      I discussed the assessment and treatment plan with the patient  The patient was provided an opportunity to ask questions and all were answered. The patient agreed with the plan and demonstrated an understanding of the instructions.   The patient was advised to call back or seek an in-person evaluation if the symptoms worsen or if the condition fails to improve as anticipated.    Cherie Lasalle N Waseem Suess, DO  [facility] [phone] (phone) [phone] (fax)  [facility]

## 2023-06-10 NOTE — Telephone Encounter (Signed)
Chief Complaint: Cough Symptoms: Dry cough, "tickle in the throat", runny nose, nasal congestion Frequency: constant  Pertinent Negatives: Patient denies fever, chest pain, sinus pain  Disposition: [] ED /[] Urgent Care (no appt availability in office) / [x] Appointment(In office/virtual)/ []  [place] [facility]/ [] Home Care/ [] Refused Recommended Disposition /[] [place] [facility]/ []  Follow-up with PCP Additional Notes: Patient's daughter stated the patient has a dry hacking cough, runny nose and nasal congestion. The daughter stated the patient does not have a sore throat it is more like a "tickle in the throat" like something is stuck there that she can not get out. She also stated the patient has tried taking Robitussin DM without much improvement to her symptoms. Patient's daughter stated it is hard to describe the patient's symptoms. She reported the patient has a hoarse voice as well. Care advice was given and patient has been scheduled for an appointment today with PCP at 1300 for evaluation.   Summary: cough and congestion / rx req   The patient's daughter has called to share that the patient is continuing to experience sore throat and congestion  The patient would like to be prescribed medication to help with their discomfort  The patient has had a history of sore throat and sinus concerns but shares with their family that they are concerned with the color of their congestion  Please contact the patient's family further when possible     Reason for Disposition  Lots of coughing  Answer Assessment - Initial Assessment Questions 1. LOCATION: "Where does it hurt?"      No pain  2. ONSET: "When did the sinus pain start?"  (e.g., hours, days)      Mid last week  3. SEVERITY: "How bad is the pain?"   (Scale 1-10; mild, moderate or severe)   - MILD (1-3): doesn't interfere with normal activities    - MODERATE (4-7): interferes with normal activities (e.g., work or school) or  awakens from sleep   - SEVERE (8-10): excruciating pain and patient unable to do any normal activities        No pain  4. RECURRENT SYMPTOM: "Have you ever had sinus problems before?" If Yes, ask: "When was the last time?" and "What happened that time?"      Yes, it happens a few times a year  5. NASAL CONGESTION: "Is the nose blocked?" If Yes, ask: "Can you open it or must you breathe through your mouth?"     Yes, She has congestion  6. NASAL DISCHARGE: "Do you have discharge from your nose?" If so ask, "What color?"     Yes, clear that I know of  7. FEVER: "Do you have a fever?" If Yes, ask: "What is it, how was it measured, and when did it start?"      No  8. OTHER SYMPTOMS: "Do you have any other symptoms?" (e.g., sore throat, cough, earache, difficulty breathing)     Chest congestion, dry cough, horse voice  Protocols used: Sinus Pain or Congestion-A-AH

## 2023-06-11 NOTE — Telephone Encounter (Signed)
Pt seen 06/10/23

## 2023-06-23 ENCOUNTER — Ambulatory Visit: Payer: Self-pay | Admitting: Family Medicine

## 2023-07-31 ENCOUNTER — Ambulatory Visit: Payer: Self-pay | Admitting: Family Medicine

## 2023-07-31 NOTE — Telephone Encounter (Signed)
 Copied from CRM [id number]. Topic: Clinical - Red Word Triage >> Jul 31, 2023  3:09 PM Emylou G wrote: [facility] that prompted transfer to Nurse Triage: increased cough - tried medication Pardue suggested.. makes her itch..  Chief Complaint: increased dry cough and cough med prescribed making her itch stopped taking it Symptoms: frequent cough runny nose, mild SOB Frequency: Tuesday Pertinent Negatives: Patient denies resp distress, wheezing Disposition: [] ED /[] Urgent Care (no appt availability in office) / [x] Appointment(In office/virtual)/ []  [place] [facility]/ [] Home Care/ [] Refused Recommended Disposition /[] [place] [facility]/ []  Follow-up with PCP Additional Notes: no appts at PCP office made appt with Dr Ermalene Searing at [facility]  Reason for Disposition  [1] Dry lingering cough AND [2] recent cold symptoms (e.g., runny nose, fever)  Answer Assessment - Initial Assessment Questions 1. NAME of MEDICINE: "What medicine(s) are you calling about?"     tussonex 2. QUESTION: "What is your question?" (e.g., double dose of medicine, side effect)     Needs new med 3. PRESCRIBER: "Who prescribed the medicine?" Reason: if prescribed by specialist, call should be referred to that group.     PCP 4. SYMPTOMS: "Do you have any symptoms?" If Yes, ask: "What symptoms are you having?"  "How bad are the symptoms (e.g., mild, moderate, severe)     Coughing  5. PREGNANCY:  "Is there any chance that you are pregnant?" "When was your last menstrual period?"     *No Answer*  Answer Assessment - Initial Assessment Questions 1. ONSET: "When did the cough begin?"      Chronic cough that worsened 2. SEVERITY: "How bad is the cough today?"      Frequent cough since Tuesday 3. SPUTUM: "Describe the color of your sputum" (none, dry cough; clear, white, yellow, green)     Dry cough  4. HEMOPTYSIS: "Are you coughing up any blood?" If so ask: "How much?" (flecks, streaks, tablespoons, etc.)      no 5. DIFFICULTY BREATHING: "Are you having difficulty breathing?" If Yes, ask: "How bad is it?" (e.g., mild, moderate, severe)    - MILD: No SOB at rest, mild SOB with walking, speaks normally in sentences, can lie down, no retractions, pulse < 100.    - MODERATE: SOB at rest, SOB with minimal exertion and prefers to sit, cannot lie down flat, speaks in phrases, mild retractions, audible wheezing, pulse 100-120.    - SEVERE: Very SOB at rest, speaks in single words, struggling to breathe, sitting hunched forward, retractions, pulse > 120      Mild  6. FEVER: "Do you have a fever?" If Yes, ask: "What is your temperature, how was it measured, and when did it start?"     no  8. LUNG HISTORY: "Do you have any history of lung disease?"  (e.g., pulmonary embolus, asthma, emphysema)     COPD 10. OTHER SYMPTOMS: "Do you have any other symptoms?" (e.g., runny nose, wheezing, chest pain)       Cough worsened Monday  Protocols used: Medication Question Call-A-AH, Cough - Chronic-A-AH

## 2023-08-01 ENCOUNTER — Encounter: Payer: Self-pay | Admitting: Family Medicine

## 2023-08-01 ENCOUNTER — Ambulatory Visit: Admitting: Family Medicine

## 2023-08-01 VITALS — BP 150/86 | HR 76 | Temp 97.9°F | Ht 64.0 in | Wt 169.5 lb

## 2023-08-01 DIAGNOSIS — J449 Chronic obstructive pulmonary disease, unspecified: Secondary | ICD-10-CM

## 2023-08-01 DIAGNOSIS — R053 Chronic cough: Secondary | ICD-10-CM | POA: Insufficient documentation

## 2023-08-01 MED ORDER — FLUTICASONE-SALMETEROL 250-50 MCG/ACT IN AEPB: 1.0000 | INHALATION_SPRAY | Freq: Two times a day (BID) | RESPIRATORY_TRACT | 11 refills | Status: DC

## 2023-08-01 NOTE — Assessment & Plan Note (Signed)
 Cough is essentially persistent and appears to be going on for months with better and worst episodes.  Current worsening of cough may be due to allergies.  There is no sign of viral or bacterial infection on exam today.  Patient feels well overall.  Given I am not PCP I am not sure she has had pulmonary function tests in the past.  Given she was doing well on Advair a few years ago and had side effects to Symbicort.  I will send Advair for her to start moderate dose 1 puff twice a day.  She will follow-up with her PCP for reevaluation. No clear indication for repeat prednisone course at this time She will also change Claritin to Zyrtec or Xyzal for better allergy control.  Return and ER precautions provided.  If severe shortness of breath or chest pain she will go to the emergency room.

## 2023-08-01 NOTE — Progress Notes (Signed)
 Patient ID: Tammy Stevenson, female    DOB: January 20, 1951, 73 y.o.   MRN: [id number]  This visit was conducted in person.  BP (!) 150/86 (BP Location: Left Arm, Patient Position: Sitting, Cuff Size: Normal)   Pulse 76   Temp 97.9 F (36.6 C) (Temporal)   Ht 5\' 4"  (1.626 m)   Wt 169 lb 8 oz (76.9 kg)   SpO2 96%   BMI 29.09 kg/m    CC:  Chief Complaint  Patient presents with   Cough    Subjective:   HPI: Tammy Stevenson is a 73 y.o. female patient of Tammy Shone, DO with history of COPD, hypertension, diabetes and chronic kidney disease presenting on 08/01/2023 for Cough    Was seen  by PCP 05/2023,  For mild COPD exacerbation.  It appears she had tried but not continued Symbicort.  At January office visit she continued to have exertional chest pain and cough.  She had improvement with prednisone but now returns with recurrence of the symptoms.   Date of onset:  1 week ago Initial symptoms included  Cough Symptoms progressed to dry hacky cough  SOB with exertion. She denies any other symptoms, no nasal congestion, sneeze, fever, ear pain, face pain, body aches or flulike symptoms. She feels well overall and is mainly just having the persistent cough. No sputum production   No ear pain, NPO ST, no HA. No fever     Sick contacts:  none COVID testing:   none     She has tried to treat with  robitussin DM  Uses albuterol  She is not using Symbicort given causes dizziness.  No SE to advair in past.    Has a history of COPD and is a former tobacco chewer.   Using claritin.   Has upcoming CT chest for lung cancer screening.      Relevant past medical, surgical, family and social history reviewed and updated as indicated. Interim medical history since our last visit reviewed. Allergies and medications reviewed and updated. Outpatient Medications Prior to Visit  Medication Sig Dispense Refill   albuterol (PROAIR HFA) 108 (90 Base) MCG/ACT inhaler Inhale 2 puffs into  the lungs every 6 (six) hours as needed for wheezing or shortness of breath. 13.4 g 0   colesevelam (WELCHOL) 625 MG tablet TAKE 3 TABLETS TWICE A DAY WITH MEALS 540 tablet 0   hydrochlorothiazide (HYDRODIURIL) 12.5 MG tablet Take 1 tablet (12.5 mg total) by mouth daily. 90 tablet 3   losartan (COZAAR) 100 MG tablet Take 1 tablet (100 mg total) by mouth daily. 90 tablet 1   metFORMIN (GLUCOPHAGE-XR) 500 MG 24 hr tablet Take 2 tablets (1,000 mg total) by mouth 2 (two) times daily with a meal. 120 tablet 0   rosuvastatin (CRESTOR) 10 MG tablet Take 1 tablet (10 mg total) by mouth daily. 90 tablet 3   budesonide-formoterol (SYMBICORT) 80-4.5 MCG/ACT inhaler Inhale 2 puffs into the lungs 2 (two) times daily. (Patient not taking: Reported on 08/01/2023) 1 each 0   chlorpheniramine-HYDROcodone (TUSSIONEX) 10-8 MG/5ML Take 5 mLs by mouth every 12 (twelve) hours as needed for cough. 70 mL 0   predniSONE (DELTASONE) 20 MG tablet Take 60mg  PO daily x 2 days, then40mg  PO daily x 2 days, then 20mg  PO daily x 3 days 13 tablet 0   No facility-administered medications prior to visit.     Per HPI unless specifically indicated in ROS section below Review of Systems  Constitutional:  Negative  for fatigue and fever.  HENT:  Negative for congestion.   Eyes:  Negative for pain.  Respiratory:  Positive for cough and shortness of breath. Negative for wheezing.   Cardiovascular:  Negative for chest pain, palpitations and leg swelling.  Gastrointestinal:  Negative for abdominal pain.  Genitourinary:  Negative for dysuria and vaginal bleeding.  Musculoskeletal:  Negative for back pain.  Neurological:  Negative for syncope, light-headedness and headaches.  Psychiatric/Behavioral:  Negative for dysphoric mood.    Objective:  BP (!) 150/86 (BP Location: Left Arm, Patient Position: Sitting, Cuff Size: Normal)   Pulse 76   Temp 97.9 F (36.6 C) (Temporal)   Ht 5\' 4"  (1.626 m)   Wt 169 lb 8 oz (76.9 kg)   SpO2 96%    BMI 29.09 kg/m   Wt Readings from Last 3 Encounters:  08/01/23 169 lb 8 oz (76.9 kg)  06/10/23 167 lb 14.4 oz (76.2 kg)  05/12/23 163 lb (73.9 kg)      Physical Exam Constitutional:      General: She is not in acute distress.    Appearance: Normal appearance. She is well-developed. She is not ill-appearing or toxic-appearing.  HENT:     Head: Normocephalic.     Right Ear: Hearing, tympanic membrane, ear canal and external ear normal. Tympanic membrane is not erythematous, retracted or bulging.     Left Ear: Hearing, tympanic membrane, ear canal and external ear normal. Tympanic membrane is not erythematous, retracted or bulging.     Nose: No mucosal edema or rhinorrhea.     Right Sinus: No maxillary sinus tenderness or frontal sinus tenderness.     Left Sinus: No maxillary sinus tenderness or frontal sinus tenderness.     Mouth/Throat:     Pharynx: Uvula midline.  Eyes:     General: Lids are normal. Lids are everted, no foreign bodies appreciated.     Conjunctiva/sclera: Conjunctivae normal.     Pupils: Pupils are equal, round, and reactive to light.  Neck:     Thyroid: No thyroid mass or thyromegaly.     Vascular: No carotid bruit.     Trachea: Trachea normal.  Cardiovascular:     Rate and Rhythm: Normal rate and regular rhythm.     Pulses: Normal pulses.     Heart sounds: Normal heart sounds, S1 normal and S2 normal. No murmur heard.    No friction rub. No gallop.  Pulmonary:     Effort: Pulmonary effort is normal. No tachypnea or respiratory distress.     Breath sounds: Decreased air movement present. No decreased breath sounds, wheezing, rhonchi or rales.  Abdominal:     General: Bowel sounds are normal.     Palpations: Abdomen is soft.     Tenderness: There is no abdominal tenderness.  Musculoskeletal:     Cervical back: Normal range of motion and neck supple.  Skin:    General: Skin is warm and dry.     Findings: No rash.  Neurological:     Mental Status: She is  alert.  Psychiatric:        Mood and Affect: Mood is not anxious or depressed.        Speech: Speech normal.        Behavior: Behavior normal. Behavior is cooperative.        Thought Content: Thought content normal.        Judgment: Judgment normal.       Results for orders placed or performed in  visit on 05/12/23  Microalbumin / creatinine urine ratio   Collection Time: 05/12/23 12:00 AM  Result Value Ref Range   Creatinine, Urine 43.8 Not Estab. mg/dL   Microalbumin, Urine <1.6 Not Estab. ug/mL   Microalb/Creat Ratio <7 0 - 29 mg/g creat  Specimen status report   Collection Time: 05/12/23 12:00 AM  Result Value Ref Range   specimen status report Comment     Assessment and Plan  Persistent cough Assessment & Plan: Cough is essentially persistent and appears to be going on for months with better and worst episodes.  Current worsening of cough may be due to allergies.  There is no sign of viral or bacterial infection on exam today.  Patient feels well overall.  Given I am not PCP I am not sure she has had pulmonary function tests in the past.  Given she was doing well on Advair a few years ago and had side effects to Symbicort.  I will send Advair for her to start moderate dose 1 puff twice a day.  She will follow-up with her PCP for reevaluation. No clear indication for repeat prednisone course at this time She will also change Claritin to Zyrtec or Xyzal for better allergy control.  Return and ER precautions provided.  If severe shortness of breath or chest pain she will go to the emergency room.   Chronic obstructive pulmonary disease, unspecified COPD type (HCC)  Other orders -     Fluticasone-Salmeterol; Inhale 1 puff into the lungs in the morning and at bedtime.  Dispense: 1 each; Refill: 11    No follow-ups on file.   Kerby Nora, MD

## 2023-08-01 NOTE — Patient Instructions (Addendum)
 Stop Claritin.  Change to Zyrtec or Xyzal  No  decongestant.  Use albuterol as needed every 4-6 hours as needed.  Start daily COPD controller inhaler: Advair 1 puff twice daily  Follow up with Dr. Payton Mccallum.  Go to ER if severe shortness of breath.

## 2023-08-18 ENCOUNTER — Ambulatory Visit: Payer: Self-pay

## 2023-08-18 ENCOUNTER — Encounter: Payer: Self-pay | Admitting: Family Medicine

## 2023-08-18 ENCOUNTER — Ambulatory Visit
Admission: RE | Admit: 2023-08-18 | Discharge: 2023-08-18 | Disposition: A | Discharge status: Home / Self Care | Source: Ambulatory Visit | Attending: Family Medicine | Admitting: Family Medicine

## 2023-08-18 ENCOUNTER — Ambulatory Visit: Admitting: Family Medicine

## 2023-08-18 VITALS — BP 159/71 | HR 107 | Temp 98.9°F | Resp 16 | Wt 171.3 lb

## 2023-08-18 DIAGNOSIS — R053 Chronic cough: Secondary | ICD-10-CM

## 2023-08-18 DIAGNOSIS — R918 Other nonspecific abnormal finding of lung field: Secondary | ICD-10-CM | POA: Diagnosis not present

## 2023-08-18 DIAGNOSIS — R059 Cough, unspecified: Secondary | ICD-10-CM | POA: Diagnosis not present

## 2023-08-18 MED ORDER — DOXYCYCLINE HYCLATE 100 MG PO TABS: 100.0000 mg | ORAL_TABLET | Freq: Two times a day (BID) | ORAL | 0 refills | Status: AC

## 2023-08-18 MED ORDER — MONTELUKAST SODIUM 10 MG PO TABS: 10.0000 mg | ORAL_TABLET | Freq: Every day | ORAL | 3 refills | Status: AC

## 2023-08-18 NOTE — Telephone Encounter (Signed)
 cough  Chief Complaint: cough; chronic Symptoms: cough Frequency: worse at night Pertinent Negatives: Patient denies fever, sob, cp Disposition: [] ED /[] Urgent Care (no appt availability in office) / [x] Appointment(In office/virtual)/ []  [facility]/ [] Home Care/ [] Refused Recommended Disposition /[] [facility]/ []  Follow-up with PCP Additional Notes: per protocol apt set for this am; care advice given, denies questions; instructed to go to ER if becomes worse.   Reason for Disposition  Cough has been present for > 3 weeks  Answer Assessment - Initial Assessment Questions 1. ONSET: "When did the cough begin?"      Has had it for sometime; Saturday became worse.  2. SEVERITY: "How bad is the cough today?"      Dry cough 3. SPUTUM: "Describe the color of your sputum" (none, dry cough; clear, white, yellow, green)     denies 4. HEMOPTYSIS: "Are you coughing up any blood?" If so ask: "How much?" (flecks, streaks, tablespoons, etc.)     denies 5. DIFFICULTY BREATHING: "Are you having difficulty breathing?" If Yes, ask: "How bad is it?" (e.g., mild, moderate, severe)    - MILD: No SOB at rest, mild SOB with walking, speaks normally in sentences, can lie down, no retractions, pulse < 100.    - MODERATE: SOB at rest, SOB with minimal exertion and prefers to sit, cannot lie down flat, speaks in phrases, mild retractions, audible wheezing, pulse 100-120.    - SEVERE: Very SOB at rest, speaks in single words, struggling to breathe, sitting hunched forward, retractions, pulse > 120      mild 6. FEVER: "Do you have a fever?" If Yes, ask: "What is your temperature, how was it measured, and when did it start?"     denies 7. CARDIAC HISTORY: "Do you have any history of heart disease?" (e.g., heart attack, congestive heart failure)      denies 8. LUNG HISTORY: "Do you have any history of lung disease?"  (e.g., pulmonary embolus, asthma, emphysema)     Denies  9. PE RISK  FACTORS: "Do you have a history of blood clots?" (or: recent major surgery, recent prolonged travel, bedridden)    denies 10. OTHER SYMPTOMS: "Do you have any other symptoms?" (e.g., runny nose, wheezing, chest pain)       Runny nose 11. PREGNANCY: "Is there any chance you are pregnant?" "When was your last menstrual period?"       na 12. TRAVEL: "Have you traveled out of the country in the last month?" (e.g., travel history, exposures)       Denies.  Protocols used: Cough - Acute Non-Productive-A-AH

## 2023-08-18 NOTE — Patient Instructions (Signed)
 Please review the attached list of medications and notify my office if there are any errors.   Go to [facility]) Imaging at [facility] for your Xrays (phone no. [phone])

## 2023-08-18 NOTE — Progress Notes (Unsigned)
 Established patient visit   Patient: Tammy Stevenson   DOB: Jul 22, 1950   74 y.o. Female  MRN: [id number] Visit Date: 08/18/2023  Today's healthcare provider: Mila Merry, MD   Chief Complaint  Patient presents with   Cough    Symptoms: cough worsen last night Frequency:x a few months   Subjective    Discussed the use of AI scribe software for clinical note transcription with the patient, who gave verbal consent to proceed.  History of Present Illness   Tammy Stevenson is a 74 year old female who presents with a persistent cough.  She has been experiencing a persistent cough since January 2025. The cough is unrelenting and has not improved despite multiple medical consultations and treatments. She uses an Advair inhaler (fluticasone with salmeterol) as prescribed two weeks ago and a 'red inhaler' for severe coughing episodes. Despite these treatments, the cough persists, stopping temporarily before resuming. In January 2025, she was prescribed prednisone and cough syrup, but these did not alleviate her symptoms. No recent shortness of breath, although she experienced it earlier in the year, prompting her to seek medical attention. No recent lung x-rays have been done, although she recalls having x-rays previously at [facility] imaging.  She has a history of seasonal asthma-like symptoms, particularly in the fall, which she and her son both experience. She reports a runny nose and takes an unspecified allergy medication at night, which was prescribed. No known allergies to antibiotics.  Her past medical history includes mouth cancer, for which she had surgery, and she has since ceased chewing tobacco. She is currently managing diabetes and cholesterol, although specific medications for these conditions were not discussed.       Medications: Outpatient Medications Prior to Visit  Medication Sig   albuterol (PROAIR HFA) 108 (90 Base) MCG/ACT inhaler Inhale 2 puffs into the lungs every 6  (six) hours as needed for wheezing or shortness of breath.   budesonide-formoterol (SYMBICORT) 80-4.5 MCG/ACT inhaler Inhale 2 puffs into the lungs 2 (two) times daily.   colesevelam (WELCHOL) 625 MG tablet TAKE 3 TABLETS TWICE A DAY WITH MEALS   fluticasone-salmeterol (ADVAIR) 250-50 MCG/ACT AEPB Inhale 1 puff into the lungs in the morning and at bedtime.   hydrochlorothiazide (HYDRODIURIL) 12.5 MG tablet Take 1 tablet (12.5 mg total) by mouth daily.   losartan (COZAAR) 100 MG tablet Take 1 tablet (100 mg total) by mouth daily.   metFORMIN (GLUCOPHAGE-XR) 500 MG 24 hr tablet Take 2 tablets (1,000 mg total) by mouth 2 (two) times daily with a meal.   rosuvastatin (CRESTOR) 10 MG tablet Take 1 tablet (10 mg total) by mouth daily.   No facility-administered medications prior to visit.   Review of Systems {Insert previous labs (optional):23779} {See past labs  Heme  Chem  Endocrine  Serology  Results Review (optional):1}   Objective    BP (!) 159/71 (BP Location: Left Arm, Patient Position: Sitting, Cuff Size: Normal)   Pulse (!) 107   Temp 98.9 F (37.2 C) (Oral)   Resp 16   Wt 171 lb 4.8 oz (77.7 kg)   SpO2 95%   BMI 29.40 kg/m {Insert last BP/Wt (optional):23777}{See vitals history (optional):1}  Physical Exam   General: Appearance:    Well developed, well nourished female in no acute distress  Eyes:    PERRL, conjunctiva/corneas clear, EOM's intact       Lungs:     Mild expiratory wheezes in upper airways, no rales or rhonchi. respirations  unlabored  Heart:    Tachycardic. Normal rhythm. No murmurs, rubs, or gallops.    MS:   All extremities are intact.    Neurologic:   Awake, alert, oriented x 3. No apparent focal neurological defect.            Assessment & Plan       Chronic Cough Persistent cough since January, unresponsive to prednisone and cough syrup. Intermittent wheezing, using Advair inhaler (fluticasone/salmeterol) and rescue inhaler. History of seasonal  cough every fall suggests possible allergic component. Cough accompanied by sinus drainage, potentially contributing to symptoms. No recent smoking history, but history of chewing tobacco, discontinued due to mouth cancer. - Order chest x-ray at [facility] - Prescribe doxycycline for potential underlying infection - Prescribe medication for sinus drainage - Send prescriptions to [facility]  Follow-up Follow-up based on chest x-ray results, expected the following day. - Review chest x-ray report once available         Mila Merry, MD  [facility] [phone] (phone) [phone] (fax)  [facility]

## 2023-08-20 ENCOUNTER — Other Ambulatory Visit: Payer: Self-pay | Admitting: Family Medicine

## 2023-08-20 DIAGNOSIS — E1165 Type 2 diabetes mellitus with hyperglycemia: Secondary | ICD-10-CM

## 2023-08-20 MED ORDER — METFORMIN HCL ER 500 MG PO TB24: 1000.0000 mg | ORAL_TABLET | Freq: Two times a day (BID) | ORAL | 3 refills | Status: AC

## 2023-08-20 MED ORDER — METFORMIN HCL 500 MG PO TABS
1000.0000 mg | ORAL_TABLET | Freq: Two times a day (BID) | ORAL | 0 refills | Status: DC
Start: 2023-08-20 — End: 2023-10-03

## 2023-08-21 ENCOUNTER — Telehealth: Payer: Self-pay

## 2023-08-21 ENCOUNTER — Ambulatory Visit: Payer: Self-pay

## 2023-08-21 NOTE — Telephone Encounter (Signed)
 Copied from CRM [id number]. Topic: Clinical - Medication Question >> Aug 21, 2023 10:31 AM Priscille Loveless wrote: Reason for CRM: Pts daughter called and wanted to know about the messages that she received about her mothers medication (metformin 500 mg) there is a note in the system that someone called yesterday. It says to continue to take metformin 500 mg xr and the question is for the medformin 500 mg. The dosage states to take 2 qty 500 metformin per day twice a day. Pts daughter said that she is not familiar with this dosage and has not been giving it to her that way.  If you can please call and explain the qty and dosage for her to ensure that the patient is getting correct amount.

## 2023-08-21 NOTE — Telephone Encounter (Signed)
 Patient's daughter returning missed call from [facility] yesterday regarding medication. Agent tx to [facility] for follow-up. NT did not speak to patient or daughter.     Copied from CRM [id number]. Topic: Clinical - Medication Question >> Aug 21, 2023 10:05 AM Priscille Loveless wrote: Reason for CRM: Someone called from Dr Buren Kos office yesterday about Metformin. Could you please explain to pts daughter, its looks like one of the metformins is disc.

## 2023-08-22 ENCOUNTER — Other Ambulatory Visit: Payer: Self-pay | Admitting: Family Medicine

## 2023-08-25 NOTE — Telephone Encounter (Signed)
 Detailed voicemail left per dpr. Ok for e2c2 to advise if call is returned

## 2023-08-27 ENCOUNTER — Other Ambulatory Visit: Payer: Self-pay | Admitting: Family Medicine

## 2023-08-27 NOTE — Telephone Encounter (Signed)
 OV 06/10/23/lab 02/28/2023 Requested Prescriptions  Pending Prescriptions Disp Refills   colesevelam (WELCHOL) 625 MG tablet [Pharmacy Med Name: COLESEVELAM HCL TABS 625MG ] 540 tablet 1    Sig: TAKE 3 TABLETS TWICE A DAY WITH MEALS     Cardiovascular:  Antilipid - Bile Acid Sequestrants Failed - 08/27/2023  3:49 PM      Failed - Valid encounter within last 12 months    Recent Outpatient Visits           1 week ago Chronic cough   [facility] Malva Limes, MD              Failed - Lipid Panel in normal range within the last 12 months    Cholesterol, Total  Date Value Ref Range Status  02/28/2023 179 100 - 199 mg/dL Final   LDL Chol Calc (NIH)  Date Value Ref Range Status  02/28/2023 98 0 - 99 mg/dL Final   HDL  Date Value Ref Range Status  02/28/2023 57 >39 mg/dL Final   Triglycerides  Date Value Ref Range Status  02/28/2023 135 0 - 149 mg/dL Final

## 2023-09-02 ENCOUNTER — Telehealth: Payer: Self-pay

## 2023-09-02 ENCOUNTER — Telehealth: Payer: Self-pay | Admitting: Family Medicine

## 2023-09-02 DIAGNOSIS — R053 Chronic cough: Secondary | ICD-10-CM

## 2023-09-02 DIAGNOSIS — J189 Pneumonia, unspecified organism: Secondary | ICD-10-CM

## 2023-09-02 NOTE — Telephone Encounter (Signed)
 Time to repeat chest xray. Order has been placed to have done at [facility]

## 2023-09-02 NOTE — Telephone Encounter (Signed)
 Called patient NA/LMTCB  Please see mychart message tat Dr. Athena Bland sent to patient on 08/21/2023 regarding Metformin.

## 2023-09-02 NOTE — Telephone Encounter (Signed)
 Copied from CRM [id number]. Topic: Clinical - Prescription Issue >> Sep 02, 2023  3:06 PM Rosaria Common wrote: Reason for CRM: Pt's daughter is calling in regards to pt's metformin medication. Callback number is [phone].

## 2023-09-24 ENCOUNTER — Encounter: Payer: Self-pay | Admitting: Podiatry

## 2023-09-24 ENCOUNTER — Ambulatory Visit: Admitting: Podiatry

## 2023-09-24 DIAGNOSIS — M79675 Pain in left toe(s): Secondary | ICD-10-CM

## 2023-09-24 DIAGNOSIS — B351 Tinea unguium: Secondary | ICD-10-CM | POA: Diagnosis not present

## 2023-09-24 DIAGNOSIS — E119 Type 2 diabetes mellitus without complications: Secondary | ICD-10-CM

## 2023-09-24 DIAGNOSIS — M79674 Pain in right toe(s): Secondary | ICD-10-CM | POA: Diagnosis not present

## 2023-09-24 NOTE — Patient Instructions (Signed)
                         Contains text generated by Abridge.                                 Contains text generated by Abridge.

## 2023-09-24 NOTE — Progress Notes (Signed)
  Subjective:  Patient ID: Tammy Stevenson, female    DOB: [birth year],  MRN: [id number]  Chief Complaint  Patient presents with   Diabetes    "Her doctor referred her for most like a Diabetic foot check."    Discussed the use of AI scribe software for clinical note transcription with the patient, who gave verbal consent to proceed.  History of Present Illness Tammy Stevenson is a 73 year old female with diabetes who presents for a podiatry evaluation of her feet.  No current problems with her feet, including burning, tingling, or numbness. No issues with sensation in her feet.  She is unable to recall her most recent A1c level but believes it is 'pretty good.'  She acknowledges that her toenails need trimming.  They are so thick they cause pain in her shoes      Objective:    Physical Exam VASCULAR: DP and PT pulse palpable. Foot is warm and well-perfused. Capillary fill time is brisk. DERMATOLOGIC: Thick, elongated dystrophic mycotic nails x10, subungual debris, crumbly texture. Normal skin turgor, texture, and temperature. No open lesions, rashes, or ulcerations. NEUROLOGIC: Normal sensation to light touch and pressure. Normal monofilament exam. No paresthesias on examination. ORTHOPEDIC: Smooth pain-free range of motion of all examined joints. No ecchymosis or bruising. No gross deformity. No pain to palpation.   No images are attached to the encounter.    Results Procedure: Toenail debridement Description: Thick and elongated dystrophic mycotic nails were debrided in length and thickness with a sharp nail nipper.   Assessment:   1. Pain due to onychomycosis of toenails of both feet   2. Encounter for diabetic foot exam [facility])      Plan:  Patient was evaluated and treated and all questions answered.  Assessment and Plan Assessment & Plan Onychomycosis Chronic onychomycosis with thick, elongated, dystrophic mycotic nails on all ten toes, subungual debris, and crumbly  texture. Condition is unlikely to resolve with treatment, including oral medications. Best management is regular trimming of nails. - Debride toenails as needed - Provide information on diabetic foot care - Advise to contact the clinic if foot issues arise  Type 2 diabetes mellitus without complications Type 2 diabetes mellitus without complications. Sensation in feet is normal, with good pulses and perfusion. Emphasis on maintaining blood glucose control to prevent complications, with a target hemoglobin A1c below 8%. Regular monitoring of feet for any cuts or wounds is crucial. - Advise daily foot checks for cuts or wounds - Emphasize importance of blood glucose control      Return if symptoms worsen or fail to improve.

## 2023-10-03 ENCOUNTER — Ambulatory Visit: Admitting: Family Medicine

## 2023-10-03 ENCOUNTER — Encounter: Payer: Self-pay | Admitting: Family Medicine

## 2023-10-03 ENCOUNTER — Ambulatory Visit
Admission: RE | Admit: 2023-10-03 | Discharge: 2023-10-03 | Disposition: A | Discharge status: Home / Self Care | Source: Ambulatory Visit | Attending: Family Medicine | Admitting: Family Medicine

## 2023-10-03 VITALS — BP 171/75 | HR 80 | Temp 98.9°F | Ht 64.0 in | Wt 165.5 lb

## 2023-10-03 DIAGNOSIS — Z1231 Encounter for screening mammogram for malignant neoplasm of breast: Secondary | ICD-10-CM

## 2023-10-03 DIAGNOSIS — R059 Cough, unspecified: Secondary | ICD-10-CM | POA: Diagnosis not present

## 2023-10-03 DIAGNOSIS — Z780 Asymptomatic menopausal state: Secondary | ICD-10-CM

## 2023-10-03 DIAGNOSIS — N182 Chronic kidney disease, stage 2 (mild): Secondary | ICD-10-CM

## 2023-10-03 DIAGNOSIS — I152 Hypertension secondary to endocrine disorders: Secondary | ICD-10-CM

## 2023-10-03 DIAGNOSIS — R918 Other nonspecific abnormal finding of lung field: Secondary | ICD-10-CM | POA: Diagnosis not present

## 2023-10-03 DIAGNOSIS — R053 Chronic cough: Secondary | ICD-10-CM

## 2023-10-03 DIAGNOSIS — E1122 Type 2 diabetes mellitus with diabetic chronic kidney disease: Secondary | ICD-10-CM | POA: Diagnosis not present

## 2023-10-03 DIAGNOSIS — E1159 Type 2 diabetes mellitus with other circulatory complications: Secondary | ICD-10-CM

## 2023-10-03 DIAGNOSIS — Z5320 Procedure and treatment not carried out because of patient's decision for unspecified reasons: Secondary | ICD-10-CM

## 2023-10-03 DIAGNOSIS — Z13820 Encounter for screening for osteoporosis: Secondary | ICD-10-CM

## 2023-10-03 DIAGNOSIS — J449 Chronic obstructive pulmonary disease, unspecified: Secondary | ICD-10-CM | POA: Diagnosis not present

## 2023-10-03 MED ORDER — IPRATROPIUM-ALBUTEROL 0.5-2.5 (3) MG/3ML IN SOLN: 3.0000 mL | Freq: Four times a day (QID) | RESPIRATORY_TRACT | 3 refills | Status: AC | PRN

## 2023-10-03 MED ORDER — LOSARTAN POTASSIUM 100 MG PO TABS
100.0000 mg | ORAL_TABLET | Freq: Every day | ORAL | 1 refills | Status: DC
Start: 2023-10-03 — End: 2024-02-16

## 2023-10-03 MED ORDER — ALBUTEROL SULFATE HFA 108 (90 BASE) MCG/ACT IN AERS: 2.0000 | INHALATION_SPRAY | Freq: Four times a day (QID) | RESPIRATORY_TRACT | 6 refills | Status: AC | PRN

## 2023-10-03 MED ORDER — BUDESONIDE-FORMOTEROL FUMARATE 160-4.5 MCG/ACT IN AERO: 2.0000 | INHALATION_SPRAY | Freq: Two times a day (BID) | RESPIRATORY_TRACT | 12 refills | Status: DC

## 2023-10-03 MED ORDER — AEROCHAMBER HOLDING CHAMBER DEVI: 1.0000 | Freq: Once | 3 refills | Status: AC

## 2023-10-03 NOTE — Progress Notes (Signed)
 Established patient visit   Patient: Tammy Stevenson   DOB: 11-18-1950   73 y.o. Female  MRN: [id number] Visit Date: 10/03/2023  Today's healthcare provider: Carlean Charter, DO   Chief Complaint  Patient presents with   Cough    Patient was seen on August 18, 2023 due to chronic cough by Dr. Shann Darnel and was diagnosed with a touch of pneumonia prescribed Doxycycline  and Montelukast .  Has had the cough since January states that she is feeling short of breath mainly when she walks up a hill to care for her chickens.  Reports that now it is productive with white phlegm and that she has pressure in the upper part of her abdomen at times.  Reports stopped chewing tobacco last June.  Stated her breathing seems to be worse this time.     Subjective    HPI Tammy Stevenson is a 73 year old female with COPD who presents with a persistent cough.  She has experienced a persistent cough, which typically occurs at the same point of the year for the past several years.  The current episode is not getting better, unlike prior episodes, and she feels it has recently worsened. The cough occurs throughout the day and night, often requiring her to get out of bed to avoid disturbing her husband. It is not triggered by any specific activity.  06/10/2023 - seen for COPD exacerbation given prednisone  taper and Tussionex 08/01/2023 - had initially improved but symptoms recurred.  Seen for persistent nonproductive cough and given Advair.  She was advised to switch her Claritin to Zyrtec or Xyzal instead. 08/18/2023 - chest x-ray ordered, doxycycline  and montelukast  ordered.  Chest x-ray showed possible mild pneumonia bilaterally.  Intended to repeat chest x-ray after 2 weeks, which was ordered but not done.  She states she does not believe she got better after completing the antibiotic she was given.  However, her granddaughter, who accompanied her today, does think that the congestion improved, even if the cough did  not.  Of note, she did not realize the montelukast  had refills and has not continued taking it.  Her current medication regimen includes metformin , hydrochlorothiazide , losartan , and medication for cholesterol, all received by mail. She was previously prescribed Advair, Symbicort , and albuterol  inhalers, but finds the Advair inhaler difficult to use due to its powder form and has run out of the albuterol  inhaler.  She states that 1 small, darkly colored medication/pill caused dizziness but is not sure which medication that was.  There is a family history of asthma, as her mother had a slight history of asthma, but she denies having asthma as a child or knowledge of any recent diagnosis.       Medications: Outpatient Medications Prior to Visit  Medication Sig Note   colesevelam  (WELCHOL ) 625 MG tablet TAKE 3 TABLETS TWICE A DAY WITH MEALS    hydrochlorothiazide  (HYDRODIURIL ) 12.5 MG tablet Take 1 tablet (12.5 mg total) by mouth daily.    metFORMIN  (GLUCOPHAGE -XR) 500 MG 24 hr tablet Take 2 tablets (1,000 mg total) by mouth 2 (two) times daily with a meal.    montelukast  (SINGULAIR ) 10 MG tablet Take 1 tablet (10 mg total) by mouth at bedtime.    rosuvastatin  (CRESTOR ) 10 MG tablet Take 1 tablet (10 mg total) by mouth daily.    [DISCONTINUED] albuterol  (PROAIR  HFA) 108 (90 Base) MCG/ACT inhaler Inhale 2 puffs into the lungs every 6 (six) hours as needed for wheezing or shortness of  breath.    [DISCONTINUED] budesonide -formoterol  (SYMBICORT ) 80-4.5 MCG/ACT inhaler Inhale 2 puffs into the lungs 2 (two) times daily.    [DISCONTINUED] fluticasone -salmeterol (ADVAIR) 250-50 MCG/ACT AEPB Inhale 1 puff into the lungs in the morning and at bedtime. 10/03/2023: patient unable to actuate   [DISCONTINUED] losartan  (COZAAR ) 100 MG tablet Take 1 tablet (100 mg total) by mouth daily.    [DISCONTINUED] metFORMIN  (GLUCOPHAGE ) 500 MG tablet Take 2 tablets (1,000 mg total) by mouth 2 (two) times daily with a meal.  Short term fill until [facility] able to fill    No facility-administered medications prior to visit.    Review of Systems  Constitutional:  Negative for appetite change, chills, fatigue and fever.  HENT:  Negative for congestion.   Respiratory:  Positive for cough and shortness of breath (with prolonged coughing). Negative for chest tightness.   Cardiovascular:  Negative for chest pain and palpitations.  Gastrointestinal:  Negative for abdominal pain, nausea and vomiting.  Neurological:  Negative for dizziness and weakness.         Objective    BP (!) 171/75 (BP Location: Right Arm, Patient Position: Sitting, Cuff Size: Normal)   Pulse 80   Temp 98.9 F (37.2 C) (Oral)   Ht 5\' 4"  (1.626 m)   Wt 165 lb 8 oz (75.1 kg)   SpO2 96%   BMI 28.41 kg/m     Physical Exam Constitutional:      Appearance: Normal appearance.  HENT:     Head: Normocephalic and atraumatic.  Eyes:     General: No scleral icterus.    Extraocular Movements: Extraocular movements intact.     Conjunctiva/sclera: Conjunctivae normal.  Cardiovascular:     Rate and Rhythm: Normal rate and regular rhythm.     Pulses: Normal pulses.     Heart sounds: Normal heart sounds.  Pulmonary:     Effort: Pulmonary effort is normal. No respiratory distress.     Breath sounds: Normal breath sounds.  Musculoskeletal:     Right lower leg: No edema.     Left lower leg: No edema.  Skin:    General: Skin is warm and dry.  Neurological:     Mental Status: She is alert and oriented to person, place, and time. Mental status is at baseline.  Psychiatric:        Mood and Affect: Mood normal.        Behavior: Behavior normal.      No results found for any visits on 10/03/23.  Assessment & Plan    Persistent cough for 3 weeks or longer -     DG Chest 2 View; Future -     AeroChamber [employer]; 1 each by Does not apply route once for 1 dose.  Dispense: 1 each; Refill: 3 -     For home use only DME  Nebulizer machine -     Ipratropium-Albuterol ; Take 3 mLs by nebulization every 6 (six) hours as needed.  Dispense: 360 mL; Refill: 3  Chronic obstructive pulmonary disease, unspecified COPD type (HCC) -     Albuterol  Sulfate HFA; Inhale 2 puffs into the lungs every 6 (six) hours as needed for wheezing or shortness of breath.  Dispense: 13.4 g; Refill: 6 -     AeroChamber Holding Chamber; 1 each by Does not apply route once for 1 dose.  Dispense: 1 each; Refill: 3 -     For home use only DME Nebulizer machine -     Ipratropium-Albuterol ;  Take 3 mLs by nebulization every 6 (six) hours as needed.  Dispense: 360 mL; Refill: 3 -     Budesonide -Formoterol  Fumarate; Inhale 2 puffs into the lungs in the morning and at bedtime.  Dispense: 1 each; Refill: 12  Hypertension associated with diabetes (HCC) -     Losartan  Potassium; Take 1 tablet (100 mg total) by mouth daily.  Dispense: 90 tablet; Refill: 1  Mammogram declined  Encounter for osteoporosis screening in asymptomatic postmenopausal patient -     DG Bone Density; Future  Encounter for screening mammogram for breast cancer -     3D Screening Mammogram, Left and Right; Future  Type 2 diabetes mellitus with stage 2 chronic kidney disease, without long-term current use of insulin (HCC) -     Hemoglobin A1c     Chronic obstructive pulmonary disease (COPD) Persistent cough with difficulty using capsule inhalers.  Patient's treatment of her COPD symptoms has likely been suboptimal.  Discussed aero chamber and nebulizer use.  Given patient's description of her difficulty coordinating her breathing with her inhaler use during coughing episodes, will go ahead and prescribe nebulizer and DuoNebs to address exacerbations. - Order chest x-ray to re-evaluate pulmonary status. - Prescribe albuterol  inhaler for as-needed use. - Prescribe Breyna  inhaler, two puffs twice daily for consistent, scheduled use. - Order nebulizer and DuoNebs for home use. -  Prescribe aero chamber for inhaler use.  Patient's granddaughter is familiar with using these for her dog and is aware of what to get if insurance does not cover it.  Chronic cough Persistent cough associated with COPD, disrupting sleep. Previous treatments ineffective. Discussed nebulizer as an alternative.  Pneumonia Previously treated with doxycycline . Persistent cough despite treatment. Follow-up imaging planned. - Order chest x-ray to evaluate for residual or recurrent pneumonia.  Hypertension Managed with losartan . Medications received through mail order pharmacy.  Type 2 diabetes mellitus Managed with metformin . A1c monitoring required. - Order A1c test to evaluate glycemic control.  Hyperlipidemia Managed with medication. Medications received through mail order pharmacy.  General Health Maintenance Recommended routine screenings and vaccinations. Discussed bone density study and mammogram. - Order bone density study for osteoporosis assessment. - Order mammogram for breast cancer screening. - Recommend shingles vaccine at pharmacy. - Recommend COVID vaccine booster at pharmacy.   Return in about 4 weeks (around 10/31/2023) for Chronic f/u; recheck.      I discussed the assessment and treatment plan with the patient  The patient was provided an opportunity to ask questions and all were answered. The patient agreed with the plan and demonstrated an understanding of the instructions.   The patient was advised to call back or seek an in-person evaluation if the symptoms worsen or if the condition fails to improve as anticipated.    Carlean Charter, DO  [facility] [phone] (phone) [phone] (fax)  [facility]

## 2023-10-03 NOTE — Patient Instructions (Addendum)
 Recommend aerochamber for inhalers if insurance does not cover.  Recommend: Shingrix vaccine for shingles at pharmacy  For you bone health: Recommend taking 1200 mg calcium  daily and 2000 units vitamin D daily.   Please call the [facility] [phone]) to schedule a routine screening mammogram and bone density test.

## 2023-10-04 LAB — HEMOGLOBIN A1C
Est. average glucose Bld gHb Est-mCnc: 243 mg/dL
Hgb A1c MFr Bld: 10.1 % — ABNORMAL HIGH (ref 4.8–5.6)

## 2023-10-07 ENCOUNTER — Ambulatory Visit: Payer: Self-pay | Admitting: Family Medicine

## 2023-10-07 DIAGNOSIS — J189 Pneumonia, unspecified organism: Secondary | ICD-10-CM

## 2023-10-07 MED ORDER — DOXYCYCLINE HYCLATE 100 MG PO TABS
100.0000 mg | ORAL_TABLET | Freq: Two times a day (BID) | ORAL | 0 refills | Status: DC
Start: 2023-10-07 — End: 2023-11-24

## 2023-10-08 ENCOUNTER — Other Ambulatory Visit: Payer: Self-pay

## 2023-10-08 MED ORDER — EMPAGLIFLOZIN 10 MG PO TABS: 10.0000 mg | ORAL_TABLET | Freq: Every day | ORAL | 1 refills | Status: DC

## 2023-11-05 ENCOUNTER — Ambulatory Visit: Payer: BC Managed Care – PPO | Admitting: Dermatology

## 2023-11-10 ENCOUNTER — Ambulatory Visit: Admitting: Family Medicine

## 2023-11-10 ENCOUNTER — Encounter: Payer: Self-pay | Admitting: Family Medicine

## 2023-11-10 VITALS — BP 141/57 | HR 63 | Resp 14 | Ht 64.0 in | Wt 169.4 lb

## 2023-11-10 DIAGNOSIS — E1165 Type 2 diabetes mellitus with hyperglycemia: Secondary | ICD-10-CM

## 2023-11-10 DIAGNOSIS — Z7984 Long term (current) use of oral hypoglycemic drugs: Secondary | ICD-10-CM | POA: Diagnosis not present

## 2023-11-10 DIAGNOSIS — J449 Chronic obstructive pulmonary disease, unspecified: Secondary | ICD-10-CM | POA: Diagnosis not present

## 2023-11-10 NOTE — Progress Notes (Signed)
 Established patient visit   Patient: Tammy Stevenson   DOB: May 22, 1950   73 y.o. Female  MRN: [id number] Visit Date: 11/10/2023  Today's healthcare provider: LAURAINE LOISE BUOY, DO   Chief Complaint  Patient presents with   Follow-up    Decline vaccine Shingrix   Subjective    HPI Tammy Stevenson is a 73 year old female with COPD and diabetes who presents for follow-up of her respiratory symptoms and blood sugar management.  Her respiratory symptoms have improved, with a significant reduction in coughing. She uses her albuterol  inhaler as needed and her Symbicort  inhaler twice daily. She has a nebulizer at home but has not needed to use it yet.  For her diabetes, she has started taking Jardiance . She is not checking her blood sugar levels as frequently as recommended but adheres to her medication regimen. Her last known A1c was 10.1.  Her daughter's recent surgery has impacted her focus on managing her own healthcare needs.      Medications: Outpatient Medications Prior to Visit  Medication Sig   albuterol  (PROAIR  HFA) 108 (90 Base) MCG/ACT inhaler Inhale 2 puffs into the lungs every 6 (six) hours as needed for wheezing or shortness of breath.   budesonide -formoterol  (BREYNA ) 160-4.5 MCG/ACT inhaler Inhale 2 puffs into the lungs in the morning and at bedtime.   colesevelam  (WELCHOL ) 625 MG tablet TAKE 3 TABLETS TWICE A DAY WITH MEALS   doxycycline  (VIBRA -TABS) 100 MG tablet Take 1 tablet (100 mg total) by mouth 2 (two) times daily.   empagliflozin  (JARDIANCE ) 10 MG TABS tablet Take 1 tablet (10 mg total) by mouth daily.   hydrochlorothiazide  (HYDRODIURIL ) 12.5 MG tablet Take 1 tablet (12.5 mg total) by mouth daily.   ipratropium-albuterol  (DUONEB) 0.5-2.5 (3) MG/3ML SOLN Take 3 mLs by nebulization every 6 (six) hours as needed.   losartan  (COZAAR ) 100 MG tablet Take 1 tablet (100 mg total) by mouth daily.   metFORMIN  (GLUCOPHAGE -XR) 500 MG 24 hr tablet Take 2 tablets (1,000 mg  total) by mouth 2 (two) times daily with a meal.   montelukast  (SINGULAIR ) 10 MG tablet Take 1 tablet (10 mg total) by mouth at bedtime.   rosuvastatin  (CRESTOR ) 10 MG tablet Take 1 tablet (10 mg total) by mouth daily.   Spacer/Aero-Holding Chambers (AEROCHAMBER HOLDING CHAMBER) DEVI 1 each by Does not apply route once for 1 dose.   No facility-administered medications prior to visit.        Objective    BP (!) 141/57 (BP Location: Left Arm, Patient Position: Sitting, Cuff Size: Large)   Pulse 63   Resp 14   Ht 5' 4 (1.626 m)   Wt 169 lb 6.4 oz (76.8 kg)   SpO2 100%   BMI 29.08 kg/m     Physical Exam Constitutional:      Appearance: Normal appearance.  HENT:     Head: Normocephalic and atraumatic.   Eyes:     General: No scleral icterus.    Extraocular Movements: Extraocular movements intact.     Conjunctiva/sclera: Conjunctivae normal.    Cardiovascular:     Rate and Rhythm: Normal rate and regular rhythm.     Pulses: Normal pulses.     Heart sounds: Normal heart sounds.  Pulmonary:     Effort: Pulmonary effort is normal. No respiratory distress.     Breath sounds: Normal breath sounds.  Abdominal:     General: Bowel sounds are normal.   Musculoskeletal:     Right  lower leg: No edema.     Left lower leg: No edema.   Skin:    General: Skin is warm and dry.   Neurological:     Mental Status: She is alert and oriented to person, place, and time. Mental status is at baseline.   Psychiatric:        Mood and Affect: Mood normal.        Behavior: Behavior normal.      No results found for any visits on 11/10/23.  Assessment & Plan    Chronic obstructive pulmonary disease, unspecified COPD type (HCC)  Uncontrolled type 2 diabetes mellitus with hyperglycemia (HCC)      Chronic Obstructive Pulmonary Disease (COPD) Improvement in cough with repeat doxycycline  course, new Symbicort  inhaler use as scheduled (non-capsule version) and albuterol  as needed.  Nebulizer reserved for difficulty in deep breathing/when albuterol  HFA is not helping. Repeat chest x-ray not needed. - Continue albuterol  inhaler as needed. - Continue Symbicort  inhaler daily. - Ensure availability of nebulizer for use if needed. - Consider referral to pulmonology if symptoms worsen.  Type 2 Diabetes Mellitus Blood sugar previously elevated with A1c of 10. Jardiance  initiated. Too early for A1c recheck. - Continue Jardiance  as prescribed. - Encourage regular blood sugar monitoring. - Plan to recheck A1c at next visit.  General Health Maintenance Breast exam and bone scan not scheduled. Eye exam visit needs confirmation. - Schedule breast exam. - Schedule bone density test. - Confirm recent eye exam and obtain records.    Return in about 3 months (around 02/10/2024) for CPE.      I discussed the assessment and treatment plan with the patient  The patient was provided an opportunity to ask questions and all were answered. The patient agreed with the plan and demonstrated an understanding of the instructions.   The patient was advised to call back or seek an in-person evaluation if the symptoms worsen or if the condition fails to improve as anticipated.    LAURAINE LOISE BUOY, DO  [facility] [phone] (phone) [phone] (fax)  [facility]

## 2023-11-10 NOTE — Patient Instructions (Signed)
 Please call the [facility] [phone]) to schedule a routine screening mammogram and bone density test.

## 2023-11-14 ENCOUNTER — Telehealth: Payer: Self-pay

## 2023-11-14 NOTE — Telephone Encounter (Signed)
 Called to inform pt needs to schedule an eye exam, As her last eye exam was in 10-17-21. Pt did not answer, but message was left. If any question advised to call us  back.

## 2023-11-24 ENCOUNTER — Encounter: Payer: Self-pay | Admitting: Family Medicine

## 2023-11-24 ENCOUNTER — Ambulatory Visit: Payer: Self-pay

## 2023-11-24 ENCOUNTER — Ambulatory Visit: Admitting: Family Medicine

## 2023-11-24 VITALS — BP 136/68 | HR 90 | Temp 98.1°F | Wt 169.6 lb

## 2023-11-24 DIAGNOSIS — J189 Pneumonia, unspecified organism: Secondary | ICD-10-CM | POA: Diagnosis not present

## 2023-11-24 DIAGNOSIS — J418 Mixed simple and mucopurulent chronic bronchitis: Secondary | ICD-10-CM | POA: Diagnosis not present

## 2023-11-24 MED ORDER — DOXYCYCLINE HYCLATE 100 MG PO TABS: 100.0000 mg | ORAL_TABLET | Freq: Two times a day (BID) | ORAL | 0 refills | Status: DC

## 2023-11-24 MED ORDER — AMOXICILLIN-POT CLAVULANATE 875-125 MG PO TABS: 1.0000 | ORAL_TABLET | Freq: Two times a day (BID) | ORAL | 0 refills | Status: DC

## 2023-11-24 NOTE — Progress Notes (Signed)
 Established patient visit   Patient: Tammy Stevenson   DOB: 07/09/1950   73 y.o. Female  MRN: [id number] Visit Date: 11/24/2023  Today's healthcare provider: LAURAINE LOISE BUOY, DO   Chief Complaint  Patient presents with   Cough    1 week congestion otc: robitussin  Decline covid/flu testing   Subjective    HPI Tammy Stevenson is a 73 year old female with COPD who presents with a persistent cough.  She has a persistent nonproductive cough that initially started 4-5 days ago in her head and progressed to her chest. Initially, she experienced a runny nose, followed by the onset of the cough. Recently, she has started coughing up a little phlegm occasionally. She notes that the cough 'goes away for a little bit and comes back.'  She has a history of COPD but does not normally experience a cough. No shortness of breath, difficulty breathing, or chest pressure. No fevers or chills. She is currently using an inhaler daily and believes she has has been taking montelukast  as prescribed. She has also been using cough medicine but feels that 'ain't nothing strong enough.' She has a nebulizer but has not yet used it. She is allergic to azithromycin, which causes hives.  She has previously been treated with doxycycline  and amoxicillin  for respiratory infections. She had pneumonia in the past couple months and has had multiple x-rays in the past due to evaluate this.      Medications: Outpatient Medications Prior to Visit  Medication Sig   albuterol  (PROAIR  HFA) 108 (90 Base) MCG/ACT inhaler Inhale 2 puffs into the lungs every 6 (six) hours as needed for wheezing or shortness of breath.   budesonide -formoterol  (BREYNA ) 160-4.5 MCG/ACT inhaler Inhale 2 puffs into the lungs in the morning and at bedtime.   colesevelam  (WELCHOL ) 625 MG tablet TAKE 3 TABLETS TWICE A DAY WITH MEALS   empagliflozin  (JARDIANCE ) 10 MG TABS tablet Take 1 tablet (10 mg total) by mouth daily.   hydrochlorothiazide   (HYDRODIURIL ) 12.5 MG tablet Take 1 tablet (12.5 mg total) by mouth daily.   ipratropium-albuterol  (DUONEB) 0.5-2.5 (3) MG/3ML SOLN Take 3 mLs by nebulization every 6 (six) hours as needed.   losartan  (COZAAR ) 100 MG tablet Take 1 tablet (100 mg total) by mouth daily.   metFORMIN  (GLUCOPHAGE -XR) 500 MG 24 hr tablet Take 2 tablets (1,000 mg total) by mouth 2 (two) times daily with a meal.   montelukast  (SINGULAIR ) 10 MG tablet Take 1 tablet (10 mg total) by mouth at bedtime.   rosuvastatin  (CRESTOR ) 10 MG tablet Take 1 tablet (10 mg total) by mouth daily.   Spacer/Aero-Holding Chambers (AEROCHAMBER HOLDING CHAMBER) DEVI 1 each by Does not apply route once for 1 dose.   [DISCONTINUED] doxycycline  (VIBRA -TABS) 100 MG tablet Take 1 tablet (100 mg total) by mouth 2 (two) times daily.   No facility-administered medications prior to visit.        Objective    BP 136/68 (BP Location: Left Arm, Patient Position: Sitting, Cuff Size: Normal)   Pulse 90   Temp 98.1 F (36.7 C) (Oral)   Wt 169 lb 9.6 oz (76.9 kg)   SpO2 98%   BMI 29.11 kg/m     Physical Exam Constitutional:      Appearance: Normal appearance.  HENT:     Head: Normocephalic and atraumatic.  Eyes:     General: No scleral icterus.    Extraocular Movements: Extraocular movements intact.     Conjunctiva/sclera: Conjunctivae normal.  Cardiovascular:     Rate and Rhythm: Normal rate and regular rhythm.     Pulses: Normal pulses.     Heart sounds: Normal heart sounds.  Pulmonary:     Effort: Pulmonary effort is normal. No respiratory distress.     Breath sounds: Wheezing and rhonchi (mild) present.  Musculoskeletal:     Right lower leg: No edema.     Left lower leg: No edema.  Skin:    General: Skin is warm and dry.  Neurological:     Mental Status: She is alert and oriented to person, place, and time. Mental status is at baseline.  Psychiatric:        Mood and Affect: Mood normal.        Behavior: Behavior normal.       No results found for any visits on 11/24/23.  Assessment & Plan    Recurrent pneumonia -     Pulmonary Visit -     Amoxicillin -Pot Clavulanate; Take 1 tablet by mouth 2 (two) times daily.  Dispense: 10 tablet; Refill: 0 -     Doxycycline  Hyclate; Take 1 tablet (100 mg total) by mouth 2 (two) times daily.  Dispense: 10 tablet; Refill: 0  Mixed simple and mucopurulent chronic bronchitis (HCC) -     Pulmonary Visit     Recurrent pneumonia; concomitant Chronic Obstructive Pulmonary Disease (COPD)  COPD exacerbation with intermittently productive cough. Previous mild pneumonia. Allergic to azithromycin.  - Prescribe Augmentin  and doxycycline . - Instruct to use nebulizer instead of albuterol  inhaler for a few days. - Refer to pulmonologist for further evaluation. - Obtain chest X-ray two weeks post-antibiotics if symptoms persist. - Instruct to rinse mouth after inhalers and nebulizers to prevent oral yeast infections.  General Health Maintenance Advised to schedule an eye exam. - Ensure scheduling and completion of an eye exam.   Return in about 3 months (around 02/24/2024) for Chronic f/u.      I discussed the assessment and treatment plan with the patient  The patient was provided an opportunity to ask questions and all were answered. The patient agreed with the plan and demonstrated an understanding of the instructions.   The patient was advised to call back or seek an in-person evaluation if the symptoms worsen or if the condition fails to improve as anticipated.    LAURAINE LOISE BUOY, DO  [facility] [phone] (phone) [phone] (fax)  [facility]

## 2023-11-24 NOTE — Telephone Encounter (Signed)
 FYI Only or Action Required?: FYI only for provider.  Patient was last seen in primary care on 11/10/2023 by Donzella Lauraine SAILOR, DO. Called Nurse Triage reporting Cough. Symptoms began several days ago. Interventions attempted: Rest, hydration, or home remedies. Symptoms are: gradually worsening.  Triage Disposition: See Physician Within 24 Hours  Patient/caregiver understands and will follow disposition?: Yes, will follow disposition  Copied from CRM [id number]. Topic: Clinical - Red Word Triage >> Nov 24, 2023  8:10 AM Montie POUR wrote: Red Word that prompted transfer to Nurse Triage:  Her cough and congestion is worse. She had an office visit on 11/10/23 and the medicine was working and over the weekend the medicine is not working. Reason for Disposition  SEVERE coughing spells (e.g., whooping sound after coughing, vomiting after coughing)  Answer Assessment - Initial Assessment Questions 1. ONSET: When did the cough begin?      4-5 days ago 2. SEVERITY: How bad is the cough today?      severe 3. SPUTUM: Describe the color of your sputum (none, dry cough; clear, white, yellow, green)     Denies sputum 4. HEMOPTYSIS: Are you coughing up any blood? If so ask: How much? (flecks, streaks, tablespoons, etc.)     denies 5. DIFFICULTY BREATHING: Are you having difficulty breathing? If Yes, ask: How bad is it? (e.g., mild, moderate, severe)    - MILD: No SOB at rest, mild SOB with walking, speaks normally in sentences, can lie down, no retractions, pulse < 100.    - MODERATE: SOB at rest, SOB with minimal exertion and prefers to sit, cannot lie down flat, speaks in phrases, mild retractions, audible wheezing, pulse 100-120.    - SEVERE: Very SOB at rest, speaks in single words, struggling to breathe, sitting hunched forward, retractions, pulse > 120      Caller is unsure 6. FEVER: Do you have a fever? If Yes, ask: What is your temperature, how was it measured, and when did it start?      denies 7. CARDIAC HISTORY: Do you have any history of heart disease? (e.g., heart attack, congestive heart failure)      denies 8. LUNG HISTORY: Do you have any history of lung disease?  (e.g., pulmonary embolus, asthma, emphysema)     Pneumonia, asthma, copd 9. PE RISK FACTORS: Do you have a history of blood clots? (or: recent major surgery, recent prolonged travel, bedridden)     denies 10. OTHER SYMPTOMS: Do you have any other symptoms? (e.g., runny nose, wheezing, chest pain)    No just cough and sounds congested 12. TRAVEL: Have you traveled out of the country in the last month? (e.g., travel history, exposures)       denies  Granddaughter is calling in on behalf of pt.  Protocols used: Cough - Acute Productive-A-AH, Cough - Acute Non-Productive-A-AH

## 2023-12-02 ENCOUNTER — Encounter: Payer: Self-pay | Admitting: Family Medicine

## 2023-12-22 ENCOUNTER — Ambulatory Visit: Payer: Self-pay

## 2023-12-22 NOTE — Telephone Encounter (Signed)
 FYI Only or Action Required?: Action required by provider: update on patient condition.  Patient was last seen in primary care on 11/24/2023 by Donzella Lauraine SAILOR, DO.  Called Nurse Triage reporting Shortness of Breath.  Symptoms began several months ago.  Interventions attempted: Prescription medications: maintenance INH, rescue INH, Neb with minimal relief.  Symptoms are: unchanged.  Triage Disposition: Call PCP Now, See HCP Within 4 Hours (Or PCP Triage)  Patient/caregiver understands and will follow disposition?: Yes      Copied from CRM [id number]. Topic: Clinical - Red Word Triage >> Dec 22, 2023 10:58 AM Rozanna MATSU wrote: [facility] that prompted transfer to Nurse Triage: SHORTNESS OF BREATHE, COUGHING, INHALERS NOT WORKING Reason for Disposition  [1] Longstanding difficulty breathing (e.g., CHF, COPD, emphysema) AND [2] WORSE than normal  Answer Assessment - Initial Assessment Questions 1. RESPIRATORY STATUS: Describe your breathing? (e.g., wheezing, shortness of breath, unable to speak, severe coughing)      SOB, cough - reoccurring cough over the past few years - PCP typically prescribes abx 2. ONSET: When did this breathing problem begin?      Years, worsening since January 3. PATTERN Does the difficult breathing come and go, or has it been constant since it started?      Comes and goes 4. SEVERITY: How bad is your breathing? (e.g., mild, moderate, severe)      Mild-moderate Grand daughter endorses constant coughing 5. RECURRENT SYMPTOM: Have you had difficulty breathing before? If Yes, ask: When was the last time? and What happened that time?      Yes, July 6. CARDIAC HISTORY: Do you have any history of heart disease? (e.g., heart attack, angina, bypass surgery, angioplasty)      Denies Endorses HTN 7. LUNG HISTORY: Do you have any history of lung disease?  (e.g., pulmonary embolus, asthma, emphysema)     COPD per chart 8. CAUSE: What do you think is  causing the breathing problem?      Possible flare 9. OTHER SYMPTOMS: Do you have any other symptoms? (e.g., chest pain, cough, dizziness, fever, runny nose)     CP/tightness r/t cough 10. O2 SATURATION MONITOR:  Do you use an oxygen saturation monitor (pulse oximeter) at home? If Yes, ask: What is your reading (oxygen level) today? What is your usual oxygen saturation reading? (e.g., 95%)       N/a Albuterol  PRN - 3-4x daily budesonide -formoterol  (BREYNA ) twice daily DuoNeb - has not used in the past few days 11. PREGNANCY: Is there any chance you are pregnant? When was your last menstrual period?       N/a 12. TRAVEL: Have you traveled out of the country in the last month? (e.g., travel history, exposures)       N/a    Triager attempted to reschedule [facility] New Pt appt per grand-daughter request, but no sooner appt available.  Triager advised grand-daughter to continue to follow up with PCP, until pt can be established. Triager offered soonest appt with alternate PCP for tomorrow, but grand-daughter unsure if pt would be willing to see alternate PCP. Grand-daughter will call back to schedule appt with PCP.  Protocols used: Breathing Difficulty-A-AH

## 2024-01-08 ENCOUNTER — Telehealth: Payer: Self-pay

## 2024-01-08 NOTE — Telephone Encounter (Signed)
   01/08/2024  Lebron Bouche  DOB: 08/28/1950 MRN: [id number]  Attempted to contact patient for medication management/review. Left HIPAA compliant message for patient to return my call at their convenience.   First attempt for patient outreach. Will follow up with patient in 3-5 business days.  Sophiah Rolin E. Marsh, PharmD Clinical Pharmacist [facility] [phone]

## 2024-01-09 ENCOUNTER — Ambulatory Visit: Payer: Self-pay

## 2024-01-09 NOTE — Telephone Encounter (Signed)
 FYI Only or Action Required?: Action required by provider: update on Tammy condition and pt wants medication for a cough.  Tammy was last seen in primary care on 11/24/2023 by Tammy Lauraine SAILOR, DO.  Called Tammy Stevenson reporting Shortness of Breath.  Symptoms began about a month ago.  Interventions attempted: Prescription medications: inhalers and breathing treatment.  Symptoms are: gradually worsening.  [place] Disposition: See HCP Within 4 Hours (Or Tammy Stevenson)  Tammy/caregiver understands and will follow disposition?: No, wishes to speak with Tammy     Copied from CRM [id number]. Topic: Clinical - Red Word Stevenson >> Jan 09, 2024  8:52 AM Tammy Stevenson wrote: Tammy Stevenson that prompted transfer to Tammy Stevenson: Tammy Stevenson calling , she is having a productive cough so hard that its hurting her, and has tried get her go to ER ,for the extreme cough and pain Reason for Disposition  [1] MILD difficulty breathing (e.g., minimal/no SOB at rest, SOB with walking, pulse < 100) AND [2] still present when not coughing  Answer Assessment - Initial Assessment Questions 1. ONSET: When did the cough begin?      months 2. SEVERITY: How bad is the cough today?      Mod-severe 3. SPUTUM: Describe the color of your sputum (e.g., none, dry cough; clear, white, yellow, green)     yellow 4. HEMOPTYSIS: Are you coughing up any blood? If Yes, ask: How much? (e.g., flecks, streaks, tablespoons, etc.)     no 5. DIFFICULTY BREATHING: Are you having difficulty breathing? If Yes, ask: How bad is it? (e.g., mild, moderate, severe)      mod 6. FEVER: Do you have a fever? If Yes, ask: What is your temperature, how was it measured, and when did it start?     no 7. CARDIAC HISTORY: Do you have any history of heart disease? (e.g., heart attack, congestive heart failure)      no 8. LUNG HISTORY: Do you have any history of lung disease?  (e.g., pulmonary embolus, asthma, emphysema)      copd 9. PE RISK FACTORS: Do you have a history of blood clots? (or: recent major surgery, recent prolonged travel, bedridden)     no 10. OTHER SYMPTOMS: Do you have any other symptoms? (e.g., runny nose, wheezing, chest pain)       Chest pain when coughing  Protocols used: Cough - Acute Productive-A-AH

## 2024-01-09 NOTE — Telephone Encounter (Signed)
 Called and spoke with patient's daughter, she stated that her Mother was not there.  I advised her of what the provider advised for the patient, she asked me if she could be seen next week.  I told her that her provider did not have any openings.  She asked about Dr. Gasper and I informed her that we didn't have any til Thursday, she said well that is when she goes see the pulmonogist.  She said I guess we will just take her to the ER or urgent care if she can't wait til Thursday.

## 2024-01-09 NOTE — Telephone Encounter (Signed)
 NO appt available, instructed UC, husband wants medications called in for cough, instructed him that she really needs to be seen, husband states so it was a waste of time to call yall and hung up.

## 2024-01-15 ENCOUNTER — Encounter: Payer: Self-pay | Admitting: Pulmonary Disease

## 2024-01-15 ENCOUNTER — Ambulatory Visit: Admitting: Pulmonary Disease

## 2024-01-15 VITALS — BP 140/66 | HR 96 | Temp 98.2°F | Ht 64.0 in | Wt 164.4 lb

## 2024-01-15 DIAGNOSIS — R0602 Shortness of breath: Secondary | ICD-10-CM | POA: Diagnosis not present

## 2024-01-15 DIAGNOSIS — R053 Chronic cough: Secondary | ICD-10-CM

## 2024-01-15 DIAGNOSIS — R918 Other nonspecific abnormal finding of lung field: Secondary | ICD-10-CM | POA: Diagnosis not present

## 2024-01-15 DIAGNOSIS — J9801 Acute bronchospasm: Secondary | ICD-10-CM

## 2024-01-15 LAB — NITRIC OXIDE: Nitric Oxide: 14

## 2024-01-15 MED ORDER — TRELEGY ELLIPTA 200-62.5-25 MCG/ACT IN AEPB: 1.0000 | INHALATION_SPRAY | Freq: Every day | RESPIRATORY_TRACT | 6 refills | Status: AC

## 2024-01-15 NOTE — Progress Notes (Unsigned)
 Subjective:    Patient ID: Tammy Stevenson, female    DOB: [birth year], 73 y.o.   MRN: [id number]  Patient Care Team: Donzella Lauraine SAILOR, DO as PCP - General [facility])  Chief Complaint  Patient presents with   Consult    Cough with occasional phlegm on and off x several year. Shortness of breath on exertion and at rest.     BACKGROUND:   HPI    Review of Systems A 10 point review of systems was performed and it is as noted above otherwise negative.   Past Medical History:  Diagnosis Date   Diabetes (HCC)    Squamous cell carcinoma of skin 03/05/2023   Right side lower lip - needs Mohs    Past Surgical History:  Procedure Laterality Date   ANKLE SURGERY Right    CATARACT EXTRACTION W/PHACO Right 01/11/2021   Procedure: CATARACT EXTRACTION PHACO AND INTRAOCULAR LENS PLACEMENT (IOC) RIGHT DIABETIC 22.66 01:43.4;  Surgeon: Jaye Fallow, MD;  Location: [facility] ORS;  Service: Ophthalmology;  Laterality: Right;   CATARACT EXTRACTION W/PHACO Left 03/15/2021   Procedure: CATARACT EXTRACTION PHACO AND INTRAOCULAR LENS PLACEMENT (IOC) LEFT DIABETIC;  Surgeon: Jaye Fallow, MD;  Location: [facility] ORS;  Service: Ophthalmology;  Laterality: Left;  7.17 0:50.4    Patient Active Problem List   Diagnosis Date Noted   Persistent cough 08/01/2023   Follow-up exam, less than 3 months since previous exam 05/12/2023   Chronic kidney disease, stage 2, mildly decreased GFR 05/12/2023   Lip lesion 02/04/2023   Unintentional weight loss 02/04/2023   Chronic obstructive pulmonary disease (COPD) (HCC) 05/27/2022   Hyperlipidemia associated with type 2 diabetes mellitus (HCC) 05/27/2022   Hypertension associated with diabetes (HCC) 05/27/2022   Tobacco use disorder 05/27/2022   Screening mammogram for breast cancer 05/27/2022   Uncontrolled type 2 diabetes mellitus with hyperglycemia (HCC) 05/27/2022   Onychomycosis 05/27/2022   Bilateral hearing loss 07/10/2015    Family History   Problem Relation Age of Onset   Heart disease Mother    Stroke Mother    Hypertension Father    Lupus Sister     Social History   Tobacco Use   Smoking status: Never    Passive exposure: Past   Smokeless tobacco: Former    Types: Chew    Quit date: 10/2022   Tobacco comments:    Pt chews tobacco quit 2024  Substance Use Topics   Alcohol use: No    Alcohol/week: 0.0 standard drinks of alcohol    Allergies  Allergen Reactions   Azithromycin Hives and Rash    Current Meds  Medication Sig   albuterol  (PROAIR  HFA) 108 (90 Base) MCG/ACT inhaler Inhale 2 puffs into the lungs every 6 (six) hours as needed for wheezing or shortness of breath.   cetirizine (ZYRTEC) 10 MG chewable tablet Chew 10 mg by mouth daily.   colesevelam  (WELCHOL ) 625 MG tablet TAKE 3 TABLETS TWICE A DAY WITH MEALS   Fluticasone -Umeclidin-Vilant (TRELEGY ELLIPTA ) 200-62.5-25 MCG/ACT AEPB Inhale 1 puff into the lungs daily.   ipratropium-albuterol  (DUONEB) 0.5-2.5 (3) MG/3ML SOLN Take 3 mLs by nebulization every 6 (six) hours as needed.   losartan  (COZAAR ) 100 MG tablet Take 1 tablet (100 mg total) by mouth daily.   metFORMIN  (GLUCOPHAGE -XR) 500 MG 24 hr tablet Take 2 tablets (1,000 mg total) by mouth 2 (two) times daily with a meal.   Spacer/Aero-Holding Chambers (AEROCHAMBER HOLDING CHAMBER) DEVI 1 each by Does not apply route once for 1  dose.    Immunization History  Administered Date(s) Administered   Pneumococcal Conjugate-13 01/16/2017   Pneumococcal Polysaccharide-23 03/20/2018   Tdap 01/16/2017        Objective:     BP (!) 140/66   Pulse 96   Temp 98.2 F (36.8 C) (Oral)   Ht 5' 4 (1.626 m)   Wt 164 lb 6.4 oz (74.6 kg)   SpO2 93%   BMI 28.22 kg/m   SpO2: 93 %  GENERAL: HEAD: Normocephalic, atraumatic.  EYES: Pupils equal, round, reactive to light.  No scleral icterus.  MOUTH:  NECK: Supple. No thyromegaly. Trachea midline. No JVD.  No adenopathy. PULMONARY: Good air entry  bilaterally.  No adventitious sounds. CARDIOVASCULAR: S1 and S2. Regular rate and rhythm.  ABDOMEN: MUSCULOSKELETAL: No joint deformity, no clubbing, no edema.  NEUROLOGIC:  SKIN: Intact,warm,dry. PSYCH:        Assessment & Plan:     ICD-10-CM   1. Chronic cough  R05.3 CT Chest High Resolution    2. Shortness of breath  R06.02 Pulmonary function test    CT Chest High Resolution    3. Opacities of both lungs present on chest x-ray  R91.8 CT Chest High Resolution    4. Bronchospasm  J98.01       Orders Placed This Encounter  Procedures   CT Chest High Resolution    Standing Status:   Future    Expected Date:   01/29/2024    Expiration Date:   01/14/2025    Preferred imaging location?:   [facility]   Pulmonary function test    Standing Status:   Future    Expiration Date:   01/14/2025    Where should this test be performed?:   Outpatient Pulmonary    What type of PFT is being ordered?:   Full PFT    Meds ordered this encounter  Medications   Fluticasone -Umeclidin-Vilant (TRELEGY ELLIPTA ) 200-62.5-25 MCG/ACT AEPB    Sig: Inhale 1 puff into the lungs daily.    Dispense:  60 each    Refill:  6     Advised if symptoms do not improve or worsen, to please contact office for sooner follow up or seek emergency care.    I spent xxx minutes of dedicated to the care of this patient on the date of this encounter to include pre-visit review of records, face-to-face time with the patient discussing conditions above, post visit ordering of testing, clinical documentation with the electronic health record, making appropriate referrals as documented, and communicating necessary findings to members of the patients care team.   C. Leita Sanders, MD Advanced Bronchoscopy [facility]    *This note was dictated using voice recognition software/Dragon.  Despite best efforts to proofread, errors can occur which can change the meaning. Any transcriptional  errors that result from this process are unintentional and may not be fully corrected at the time of dictation.

## 2024-01-15 NOTE — Patient Instructions (Signed)
 VISIT SUMMARY:  Today, you were seen for a chronic cough that has been persistent for several years and has worsened this year. You have tried various inhalers and a nebulizer with limited relief. We discussed your symptoms, including the productive nature of your cough and the presence of wheezing and shortness of breath.  YOUR PLAN:  -CHRONIC PRODUCTIVE COUGH WITH WHEEZING AND SHORTNESS OF BREATH: A chronic productive cough means you have been coughing up mucus for a long time, and it is accompanied by wheezing and difficulty breathing. We are prescribing Trelegy Ellipta  200 mcg to help manage your symptoms. We also recommend pulmonary function tests to better understand your lung function. Please use your inhalers and nebulizer consistently as instructed.  -ABNORMAL LUNG IMAGING FINDINGS: Previous lung imaging showed signs of pneumonia, which improved with antibiotics but recurred after stopping the medication. This suggests a possible chronic or recurrent lung infection. We are ordering a high-resolution chest CT to get a clearer picture of your lungs.  INSTRUCTIONS:  Please follow up with the pulmonary function tests and the high-resolution chest CT as soon as possible. Use the Trelegy Ellipta  200 mcg inhaler daily as prescribed. If you have any questions or if your symptoms worsen, please contact our office.

## 2024-01-20 NOTE — Telephone Encounter (Signed)
   01/20/2024  Lebron Bouche  DOB: 02/17/1951 MRN: [id number]  Attempted to contact patient for medication management/review. Left HIPAA compliant message for patient to return my call at their convenience.   Second attempt for patient outreach. Will follow up with patient in 3-5 business days.  Dennison Mcdaid E. Marsh, PharmD Clinical Pharmacist [facility] [phone]

## 2024-01-26 ENCOUNTER — Ambulatory Visit
Admission: RE | Admit: 2024-01-26 | Discharge: 2024-01-26 | Disposition: A | Discharge status: Home / Self Care | Source: Ambulatory Visit | Attending: Pulmonary Disease | Admitting: Pulmonary Disease

## 2024-01-26 DIAGNOSIS — R053 Chronic cough: Secondary | ICD-10-CM

## 2024-01-26 DIAGNOSIS — R918 Other nonspecific abnormal finding of lung field: Secondary | ICD-10-CM

## 2024-01-26 DIAGNOSIS — R0602 Shortness of breath: Secondary | ICD-10-CM

## 2024-01-26 DIAGNOSIS — J479 Bronchiectasis, uncomplicated: Secondary | ICD-10-CM | POA: Diagnosis not present

## 2024-01-27 ENCOUNTER — Ambulatory Visit: Payer: Self-pay | Admitting: Pulmonary Disease

## 2024-01-29 NOTE — Progress Notes (Signed)
 Attempted to call pt but call could not be completed at this time. Will try again later.

## 2024-02-02 NOTE — Progress Notes (Signed)
 Called and Lvm to call back or check MyChart for the result note.

## 2024-02-09 NOTE — Telephone Encounter (Signed)
   02/09/2024  Lebron Bouche  DOB: June 05, 1950 MRN: [id number]  Attempted to contact patient for medication management/review. Left HIPAA compliant message for patient to return my call at their convenience.   Third attempt for patient outreach.   Landin Tallon E. Marsh, PharmD Clinical Pharmacist [facility] [phone]

## 2024-02-16 ENCOUNTER — Ambulatory Visit: Admitting: Family Medicine

## 2024-02-16 ENCOUNTER — Encounter: Payer: Self-pay | Admitting: Family Medicine

## 2024-02-16 VITALS — BP 140/67 | HR 69 | Temp 98.2°F | Ht 64.0 in | Wt 165.7 lb

## 2024-02-16 DIAGNOSIS — I152 Hypertension secondary to endocrine disorders: Secondary | ICD-10-CM

## 2024-02-16 DIAGNOSIS — E1159 Type 2 diabetes mellitus with other circulatory complications: Secondary | ICD-10-CM

## 2024-02-16 DIAGNOSIS — E1122 Type 2 diabetes mellitus with diabetic chronic kidney disease: Secondary | ICD-10-CM

## 2024-02-16 DIAGNOSIS — N182 Chronic kidney disease, stage 2 (mild): Secondary | ICD-10-CM

## 2024-02-16 DIAGNOSIS — Z0000 Encounter for general adult medical examination without abnormal findings: Secondary | ICD-10-CM

## 2024-02-16 DIAGNOSIS — Z7984 Long term (current) use of oral hypoglycemic drugs: Secondary | ICD-10-CM

## 2024-02-16 DIAGNOSIS — Z79899 Other long term (current) drug therapy: Secondary | ICD-10-CM | POA: Diagnosis not present

## 2024-02-16 DIAGNOSIS — E1169 Type 2 diabetes mellitus with other specified complication: Secondary | ICD-10-CM

## 2024-02-16 DIAGNOSIS — J449 Chronic obstructive pulmonary disease, unspecified: Secondary | ICD-10-CM | POA: Diagnosis not present

## 2024-02-16 DIAGNOSIS — E785 Hyperlipidemia, unspecified: Secondary | ICD-10-CM

## 2024-02-16 DIAGNOSIS — E1165 Type 2 diabetes mellitus with hyperglycemia: Secondary | ICD-10-CM | POA: Diagnosis not present

## 2024-02-16 DIAGNOSIS — Z0001 Encounter for general adult medical examination with abnormal findings: Secondary | ICD-10-CM

## 2024-02-16 MED ORDER — COLESEVELAM HCL 625 MG PO TABS: 1875.0000 mg | ORAL_TABLET | Freq: Two times a day (BID) | ORAL | 1 refills | Status: AC

## 2024-02-16 MED ORDER — LOSARTAN POTASSIUM 100 MG PO TABS: 100.0000 mg | ORAL_TABLET | Freq: Every day | ORAL | 1 refills | Status: DC

## 2024-02-16 MED ORDER — EMPAGLIFLOZIN 10 MG PO TABS: 10.0000 mg | ORAL_TABLET | Freq: Every day | ORAL | 1 refills | Status: AC

## 2024-02-16 NOTE — Progress Notes (Signed)
 Complete physical exam   Patient: Tammy Stevenson   DOB: 1950-08-19   73 y.o. Female  MRN: [id number] Visit Date: 02/16/2024  Today's healthcare provider: LAURAINE LOISE BUOY, DO   Chief Complaint  Patient presents with   Annual Exam   Subjective    Tammy Stevenson is a 73 y.o. female who presents today for a complete physical exam.    HPI  Tammy Stevenson is a 73 year old female who presents for an annual physical exam.  She recently visited a lung specialist and is awaiting follow-up communication. Her breathing has improved significantly, and she experiences only occasional coughing. She uses the Trelegy Ellipta  inhaler every morning.  She has not been checking her blood sugars regularly and sometimes skips her hydrochlorothiazide  medication. She takes her blood pressure medication every morning and is consistent with her cholesterol medication, rosuvastatin . However, she ran out of Jardiance  and did not inform anyone, resulting in a lapse in taking it. She has not taken any medication today in anticipation of blood work.  She is due for a bone density test and a mammogram, and she has seen an eye doctor once this year.    Past Medical History:  Diagnosis Date   Diabetes (HCC)    Squamous cell carcinoma of skin 03/05/2023   Right side lower lip - needs Mohs   Past Surgical History:  Procedure Laterality Date   ANKLE SURGERY Right    CATARACT EXTRACTION W/PHACO Right 01/11/2021   Procedure: CATARACT EXTRACTION PHACO AND INTRAOCULAR LENS PLACEMENT (IOC) RIGHT DIABETIC 22.66 01:43.4;  Surgeon: Jaye Fallow, MD;  Location: [facility] ORS;  Service: Ophthalmology;  Laterality: Right;   CATARACT EXTRACTION W/PHACO Left 03/15/2021   Procedure: CATARACT EXTRACTION PHACO AND INTRAOCULAR LENS PLACEMENT (IOC) LEFT DIABETIC;  Surgeon: Jaye Fallow, MD;  Location: [facility] ORS;  Service: Ophthalmology;  Laterality: Left;  7.17 0:50.4   Social History   Socioeconomic History   Marital  status: Married    Spouse name: Not on file   Number of children: Not on file   Years of education: Not on file   Highest education level: Not on file  Occupational History   Not on file  Tobacco Use   Smoking status: Never    Passive exposure: Past   Smokeless tobacco: Former    Types: Chew    Quit date: 10/2022   Tobacco comments:    Pt chews tobacco quit 2024  Substance and Sexual Activity   Alcohol use: No    Alcohol/week: 0.0 standard drinks of alcohol   Drug use: No   Sexual activity: Not on file  Other Topics Concern   Not on file  Social History Narrative   Not on file   Social Drivers of Health   Financial Resource Strain: Not on file  Food Insecurity: No Food Insecurity (11/10/2023)   Hunger Vital Sign    Worried About Running Out of Food in the Last Year: Never true    Ran Out of Food in the Last Year: Never true  Transportation Needs: No Transportation Needs (11/10/2023)   PRAPARE - Administrator, Civil Service (Medical): No    Lack of Transportation (Non-Medical): No  Physical Activity: Sufficiently Active (11/10/2023)   Exercise Vital Sign    Days of Exercise per Week: 7 days    Minutes of Exercise per Session: 60 min  Stress: No Stress Concern Present (11/10/2023)   [employer] of Occupational Health - Occupational Stress Questionnaire  Feeling of Stress: Not at all  Social Connections: Not on file  Intimate Partner Violence: Not At Risk (11/10/2023)   Humiliation, Afraid, Rape, and Kick questionnaire    Fear of Current or Ex-Partner: No    Emotionally Abused: No    Physically Abused: No    Sexually Abused: No   Family Status  Relation Name Status   Mother  Deceased   Father  Deceased   Sister  (Not Specified)  No partnership data on file   Family History  Problem Relation Age of Onset   Heart disease Mother    Stroke Mother    Hypertension Father    Lupus Sister    Allergies  Allergen Reactions   Azithromycin Hives and  Rash    Patient Care Team: Rachelann Enloe N, DO as PCP - General (Family Medicine)   Medications: Outpatient Medications Prior to Visit  Medication Sig   albuterol  (PROAIR  HFA) 108 (90 Base) MCG/ACT inhaler Inhale 2 puffs into the lungs every 6 (six) hours as needed for wheezing or shortness of breath.   cetirizine (ZYRTEC) 10 MG chewable tablet Chew 10 mg by mouth daily.   Fluticasone -Umeclidin-Vilant (TRELEGY ELLIPTA ) 200-62.5-25 MCG/ACT AEPB Inhale 1 puff into the lungs daily.   hydrochlorothiazide  (HYDRODIURIL ) 12.5 MG tablet Take 1 tablet (12.5 mg total) by mouth daily.   ipratropium-albuterol  (DUONEB) 0.5-2.5 (3) MG/3ML SOLN Take 3 mLs by nebulization every 6 (six) hours as needed.   metFORMIN  (GLUCOPHAGE -XR) 500 MG 24 hr tablet Take 2 tablets (1,000 mg total) by mouth 2 (two) times daily with a meal.   montelukast  (SINGULAIR ) 10 MG tablet Take 1 tablet (10 mg total) by mouth at bedtime.   rosuvastatin  (CRESTOR ) 10 MG tablet Take 1 tablet (10 mg total) by mouth daily.   Spacer/Aero-Holding Chambers (AEROCHAMBER HOLDING CHAMBER) DEVI 1 each by Does not apply route once for 1 dose.   [DISCONTINUED] colesevelam  (WELCHOL ) 625 MG tablet TAKE 3 TABLETS TWICE A DAY WITH MEALS   [DISCONTINUED] losartan  (COZAAR ) 100 MG tablet Take 1 tablet (100 mg total) by mouth daily.   [DISCONTINUED] amoxicillin -clavulanate (AUGMENTIN ) 875-125 MG tablet Take 1 tablet by mouth 2 (two) times daily. (Patient not taking: Reported on 01/15/2024)   [DISCONTINUED] doxycycline  (VIBRA -TABS) 100 MG tablet Take 1 tablet (100 mg total) by mouth 2 (two) times daily. (Patient not taking: Reported on 01/15/2024)   [DISCONTINUED] empagliflozin  (JARDIANCE ) 10 MG TABS tablet Take 1 tablet (10 mg total) by mouth daily. (Patient not taking: Reported on 02/16/2024)   No facility-administered medications prior to visit.    Review of Systems  Constitutional:  Negative for chills, fatigue and fever.  HENT:  Negative for congestion,  ear pain, rhinorrhea, sneezing and sore throat.   Eyes: Negative.  Negative for pain, redness and visual disturbance.  Respiratory:  Negative for cough, shortness of breath and wheezing.   Cardiovascular:  Negative for chest pain and leg swelling.  Gastrointestinal:  Negative for abdominal pain, blood in stool, constipation, diarrhea and nausea.  Endocrine: Negative for polydipsia, polyphagia and polyuria.  Genitourinary: Negative.  Negative for dysuria, flank pain, hematuria, pelvic pain, vaginal bleeding and vaginal discharge.  Musculoskeletal:  Negative for arthralgias, back pain, gait problem and joint swelling.  Skin:  Negative for rash.  Neurological: Negative.  Negative for dizziness, tremors, seizures, weakness, light-headedness, numbness and headaches.  Hematological:  Negative for adenopathy.  Psychiatric/Behavioral: Negative.  Negative for behavioral problems, confusion and dysphoric mood. The patient is not nervous/anxious and is not hyperactive.  Objective    BP (!) 140/67   Pulse 69   Temp 98.2 F (36.8 C)   Ht 5' 4 (1.626 m)   Wt 165 lb 11.2 oz (75.2 kg)   SpO2 99%   BMI 28.44 kg/m    Physical Exam Vitals and nursing note reviewed.  Constitutional:      General: She is awake.     Appearance: Normal appearance.  HENT:     Head: Normocephalic and atraumatic.     Right Ear: Tympanic membrane, ear canal and external ear normal.     Left Ear: Tympanic membrane, ear canal and external ear normal.     Nose: Nose normal.     Mouth/Throat:     Mouth: Mucous membranes are moist.     Pharynx: Oropharynx is clear. No oropharyngeal exudate or posterior oropharyngeal erythema.  Eyes:     General: No scleral icterus.    Extraocular Movements: Extraocular movements intact.     Conjunctiva/sclera: Conjunctivae normal.     Pupils: Pupils are equal, round, and reactive to light.  Neck:     Thyroid: No thyromegaly or thyroid tenderness.  Cardiovascular:     Rate and  Rhythm: Normal rate and regular rhythm.     Pulses: Normal pulses.     Heart sounds: Normal heart sounds.  Pulmonary:     Effort: Pulmonary effort is normal. No tachypnea, bradypnea or respiratory distress.     Breath sounds: Normal breath sounds. No stridor. No wheezing, rhonchi or rales.  Abdominal:     General: Bowel sounds are normal. There is no distension.     Palpations: Abdomen is soft. There is no mass.     Tenderness: There is no abdominal tenderness. There is no guarding.     Hernia: No hernia is present.  Musculoskeletal:     Cervical back: Normal range of motion and neck supple.     Right lower leg: Edema (+1) present.     Left lower leg: Edema (trace) present.  Lymphadenopathy:     Cervical: No cervical adenopathy.  Skin:    General: Skin is warm and dry.  Neurological:     Mental Status: She is alert and oriented to person, place, and time. Mental status is at baseline.  Psychiatric:        Mood and Affect: Mood normal.        Behavior: Behavior normal.      Last depression screening scores    02/16/2024   10:08 AM 11/10/2023    8:34 AM 06/10/2023    1:16 PM  PHQ 2/9 Scores  PHQ - 2 Score 0 0 0  PHQ- 9 Score 0 0 0   Last fall risk screening    02/16/2024   10:08 AM  Fall Risk   Falls in the past year? 0  Number falls in past yr: 0  Injury with Fall? 0  Follow up Falls evaluation completed   Last Audit-C alcohol use screening    02/16/2024   10:24 AM  Alcohol Use Disorder Test (AUDIT)  1. How often do you have a drink containing alcohol? 0  2. How many drinks containing alcohol do you have on a typical day when you are drinking? 0  3. How often do you have six or more drinks on one occasion? 0  AUDIT-C Score 0   A score of 3 or more in women, and 4 or more in men indicates increased risk for alcohol abuse, EXCEPT if all of  the points are from question 1   Results for orders placed or performed in visit on 02/16/24  Microalbumin / creatinine urine  ratio  Result Value Ref Range   Creatinine, Urine 70.6 Not Estab. mg/dL   Microalbumin, Urine <6.9 Not Estab. ug/mL   Microalb/Creat Ratio <4 0 - 29 mg/g creat  Comprehensive metabolic panel with GFR  Result Value Ref Range   Glucose 335 (H) 70 - 99 mg/dL   BUN 11 8 - 27 mg/dL   Creatinine, Ser 9.13 0.57 - 1.00 mg/dL   eGFR 72 >40 fO/fpw/8.26   BUN/Creatinine Ratio 13 12 - 28   Sodium 136 134 - 144 mmol/L   Potassium 4.5 3.5 - 5.2 mmol/L   Chloride 99 96 - 106 mmol/L   CO2 22 20 - 29 mmol/L   Calcium  9.5 8.7 - 10.3 mg/dL   Total Protein 6.5 6.0 - 8.5 g/dL   Albumin 4.3 3.8 - 4.8 g/dL   Globulin, Total 2.2 1.5 - 4.5 g/dL   Bilirubin Total 0.6 0.0 - 1.2 mg/dL   Alkaline Phosphatase 85 49 - 135 IU/L   AST 16 0 - 40 IU/L   ALT 12 0 - 32 IU/L  Hemoglobin A1c  Result Value Ref Range   Hgb A1c MFr Bld 14.8 (H) 4.8 - 5.6 %   Est. average glucose Bld gHb Est-mCnc 378 mg/dL  Lipid panel  Result Value Ref Range   Cholesterol, Total 112 100 - 199 mg/dL   Triglycerides 884 0 - 149 mg/dL   HDL 51 >60 mg/dL   VLDL Cholesterol Cal 21 5 - 40 mg/dL   LDL Chol Calc (NIH) 40 0 - 99 mg/dL   Chol/HDL Ratio 2.2 0.0 - 4.4 ratio  Vitamin B12  Result Value Ref Range   Vitamin B-12 490 232 - 1,245 pg/mL    Assessment & Plan    Routine Health Maintenance and Physical Exam  Exercise Activities and Dietary recommendations  Goals   None     Immunization History  Administered Date(s) Administered   Pneumococcal Conjugate-13 01/16/2017   Pneumococcal Polysaccharide-23 03/20/2018   Tdap 01/16/2017    Health Maintenance  Topic Date Due   COVID-19 Vaccine (1) Never done   DEXA SCAN  Never done   OPHTHALMOLOGY EXAM  10/18/2022   Influenza Vaccine  08/17/2024 (Originally 12/19/2023)   Mammogram  10/02/2024 (Originally 10/23/1991)   Zoster Vaccines- Shingrix (1 of 2) 02/09/2025 (Originally 10/23/1970)   Colonoscopy  07/16/2026 (Originally 10/22/1996)   FOOT EXAM  05/11/2024   HEMOGLOBIN A1C   08/15/2024   Diabetic kidney evaluation - eGFR measurement  02/15/2025   Diabetic kidney evaluation - Urine ACR  02/15/2025   DTaP/Tdap/Td (2 - Td or Tdap) 01/17/2027   Pneumococcal Vaccine: 50+ Years  Completed   Hepatitis C Screening  Completed   Meningococcal B Vaccine  Aged Out    Discussed health benefits of physical activity, and encouraged her to engage in regular exercise appropriate for her age and condition.   Annual physical exam  Type 2 diabetes mellitus with stage 2 chronic kidney disease, without long-term current use of insulin (HCC) -     Microalbumin / creatinine urine ratio -     Hemoglobin A1c -     Vitamin B12 -     Empagliflozin ; Take 1 tablet (10 mg total) by mouth daily.  Dispense: 90 tablet; Refill: 1  Chronic obstructive pulmonary disease, unspecified COPD type (HCC)  Hypertension associated with diabetes (HCC) -  Comprehensive metabolic panel with GFR -     Losartan  Potassium; Take 1 tablet (100 mg total) by mouth daily.  Dispense: 90 tablet; Refill: 1  Hyperlipidemia associated with type 2 diabetes mellitus (HCC) -     Lipid panel -     Colesevelam  HCl; Take 3 tablets (1,875 mg total) by mouth 2 (two) times daily with a meal.  Dispense: 540 tablet; Refill: 1  High risk medication use -     Vitamin B12      Annual physical exam Annual wellness visit conducted with no specific concerns. Physical exam overall unremarkable except as noted above. Routine lab work ordered as noted. Discussed importance of regular screenings and vaccinations. - Perform blood work today. - Discussed flu shot; declined. - Discussed shingles vaccine; available at pharmacy if she changes mind. - Discussed bone density test; she to call and schedule. - Discussed mammogram; she to call and schedule.  Type 2 diabetes mellitus with stage chronic kidney disease, without current long-term use of insulin Not monitoring blood glucose regularly and not taking Jardiance  due to  running out. Emphasized medication adherence. - Send refill for Jardiance  to mail order pharmacy. - Ensure metformin  is available at the pharmacy. - Encourage regular blood glucose monitoring. - Recommend scheduling follow-up with ophthalmologist for diabetes-related vision concerns.  Chronic obstructive pulmonary disease (COPD) Improved breathing and reduced coughing. CT scan showed good results with evidence of previous infection. Using Trelegy Ellipta  inhaler daily. - Continue Trelegy Ellipta  inhaler daily. - Undergo further pulmonary function tests.  Hypertension associated with diabetes Chronic, stable.  Taking antihypertensive medication daily without issues. - Continue hydrochlorothiazide  12.5 mg daily and losartan  100 mg daily. - Ensure hydrochlorothiazide  is available at the pharmacy.  Hyperlipidemia associated with type 2 diabetes mellitus Taking rosuvastatin  regularly. - Continue rosuvastatin  10 mg daily. - Ensure rosuvastatin  is available through [facility].     Return in about 3 months (around 05/17/2024) for Chronic f/u, DM.     I discussed the assessment and treatment plan with the patient  The patient was provided an opportunity to ask questions and all were answered. The patient agreed with the plan and demonstrated an understanding of the instructions.   The patient was advised to call back or seek an in-person evaluation if the symptoms worsen or if the condition fails to improve as anticipated.    LAURAINE LOISE BUOY, DO  [facility] [phone] (phone) [phone] (fax)  [facility]

## 2024-02-16 NOTE — Patient Instructions (Addendum)
 Please call the [facility] [phone]) to schedule a routine screening mammogram and bone density test.  Call [facility] to schedule your annual eye exam.

## 2024-02-17 LAB — COMPREHENSIVE METABOLIC PANEL WITH GFR
ALT: 12 IU/L (ref 0–32)
AST: 16 IU/L (ref 0–40)
Albumin: 4.3 g/dL (ref 3.8–4.8)
Alkaline Phosphatase: 85 IU/L (ref 49–135)
BUN/Creatinine Ratio: 13 (ref 12–28)
BUN: 11 mg/dL (ref 8–27)
Bilirubin Total: 0.6 mg/dL (ref 0.0–1.2)
CO2: 22 mmol/L (ref 20–29)
Calcium: 9.5 mg/dL (ref 8.7–10.3)
Chloride: 99 mmol/L (ref 96–106)
Creatinine, Ser: 0.86 mg/dL (ref 0.57–1.00)
Globulin, Total: 2.2 g/dL (ref 1.5–4.5)
Glucose: 335 mg/dL — ABNORMAL HIGH (ref 70–99)
Potassium: 4.5 mmol/L (ref 3.5–5.2)
Sodium: 136 mmol/L (ref 134–144)
Total Protein: 6.5 g/dL (ref 6.0–8.5)
eGFR: 72 mL/min/1.73 (ref >59)

## 2024-02-17 LAB — LIPID PANEL
Chol/HDL Ratio: 2.2 ratio (ref 0.0–4.4)
Cholesterol, Total: 112 mg/dL (ref 100–199)
HDL: 51 mg/dL (ref >39)
LDL Chol Calc (NIH): 40 mg/dL (ref 0–99)
Triglycerides: 115 mg/dL (ref 0–149)
VLDL Cholesterol Cal: 21 mg/dL (ref 5–40)

## 2024-02-17 LAB — VITAMIN B12: Vitamin B-12: 490 pg/mL (ref 232–1245)

## 2024-02-17 LAB — HEMOGLOBIN A1C
Est. average glucose Bld gHb Est-mCnc: 378 mg/dL
Hgb A1c MFr Bld: 14.8 % — ABNORMAL HIGH (ref 4.8–5.6)

## 2024-02-17 LAB — MICROALBUMIN / CREATININE URINE RATIO
Creatinine, Urine: 70.6 mg/dL
Microalb/Creat Ratio: <4 mg/g{creat} (ref 0–29)
Microalbumin, Urine: <3 ug/mL

## 2024-02-23 ENCOUNTER — Ambulatory Visit: Payer: Self-pay | Admitting: Family Medicine

## 2024-03-01 ENCOUNTER — Encounter

## 2024-03-04 ENCOUNTER — Ambulatory Visit (INDEPENDENT_AMBULATORY_CARE_PROVIDER_SITE_OTHER)

## 2024-03-04 DIAGNOSIS — R0602 Shortness of breath: Secondary | ICD-10-CM

## 2024-03-04 LAB — PULMONARY FUNCTION TEST
DL/VA % pred: 131 %
DL/VA: 5.48 ml/min/mmHg/L
DLCO unc % pred: 132 %
DLCO unc: 25.23 ml/min/mmHg
FEF 25-75 Post: 0.83 L/s
FEF 25-75 Pre: 0.87 L/s
FEF2575-%Change-Post: -3 %
FEF2575-%Pred-Post: 46 %
FEF2575-%Pred-Pre: 48 %
FEV1-%Change-Post: 7 %
FEV1-%Pred-Post: 73 %
FEV1-%Pred-Pre: 68 %
FEV1-Post: 1.62 L
FEV1-Pre: 1.51 L
FEV1FVC-%Change-Post: 10 %
FEV1FVC-%Pred-Pre: 75 %
FEV6-%Change-Post: 0 %
FEV6-%Pred-Post: 91 %
FEV6-%Pred-Pre: 91 %
FEV6-Post: 2.56 L
FEV6-Pre: 2.56 L
FEV6FVC-%Change-Post: 0 %
FEV6FVC-%Pred-Post: 103 %
FEV6FVC-%Pred-Pre: 104 %
FVC-%Change-Post: -3 %
FVC-%Pred-Post: 88 %
FVC-%Pred-Pre: 91 %
FVC-Post: 2.58 L
FVC-Pre: 2.67 L
Post FEV1/FVC ratio: 63 %
Post FEV6/FVC ratio: 99 %
Pre FEV1/FVC ratio: 57 %
Pre FEV6/FVC Ratio: 99 %
RV % pred: 183 %
RV: 4.08 L
TLC % pred: 131 %
TLC: 6.67 L

## 2024-03-04 NOTE — Patient Instructions (Signed)
 Full PFT completed today ? ?

## 2024-03-04 NOTE — Progress Notes (Signed)
 Full PFT completed today ? ?

## 2024-03-05 ENCOUNTER — Ambulatory Visit: Admitting: Pulmonary Disease

## 2024-03-05 ENCOUNTER — Encounter: Payer: Self-pay | Admitting: Pulmonary Disease

## 2024-03-05 VITALS — BP 138/60 | HR 70 | Temp 97.7°F | Ht 64.0 in | Wt 167.8 lb

## 2024-03-05 DIAGNOSIS — Z87891 Personal history of nicotine dependence: Secondary | ICD-10-CM

## 2024-03-05 DIAGNOSIS — J4540 Moderate persistent asthma, uncomplicated: Secondary | ICD-10-CM | POA: Diagnosis not present

## 2024-03-05 DIAGNOSIS — J479 Bronchiectasis, uncomplicated: Secondary | ICD-10-CM

## 2024-03-05 DIAGNOSIS — R053 Chronic cough: Secondary | ICD-10-CM

## 2024-03-05 NOTE — Patient Instructions (Signed)
 VISIT SUMMARY:  Today, you came in for a follow-up appointment to check on your asthma. Your symptoms, including a persistent cough, have improved with the use of your inhaler, and you have not needed to use your rescue inhaler. Recent lung function tests showed some improvement and mild enlargement of the bronchial tubes, but no scarring in the lungs. You also mentioned that you have not received a flu shot.  YOUR PLAN:  -ASTHMA: Asthma is a condition where your airways become inflamed and narrow, making it hard to breathe. Your lung function tests have shown improvement with the use of your inhaler, and your symptoms are well-controlled. Please continue using your inhaler daily, one puff per day. We will schedule a follow-up appointment in four months to monitor your condition.  -INFLUENZA VACCINATION REFUSAL: You have chosen not to receive the influenza vaccination, stating that you have never had one before. It is important to consider getting vaccinated to protect yourself from the flu, especially since you have asthma.  INSTRUCTIONS:  Please continue using your inhaler daily, one puff per day. Schedule a follow-up appointment in four months to monitor your asthma. Consider getting the influenza vaccination to protect yourself from the flu, especially since you have asthma.

## 2024-03-05 NOTE — Progress Notes (Unsigned)
 Subjective:    Patient ID: Tammy Stevenson, female    DOB: 1951/03/23, 73 y.o.   MRN: [id number]  Patient Care Team: Donzella Lauraine SAILOR, DO as PCP - General [facility] Medicine)  Chief Complaint  Patient presents with   Medical Management of Chronic Issues    No breathing problems.    BACKGROUND/INTERVAL:Tammy Stevenson presents for evaluation of shortness of breath and cough present for several years.  She has never smoked however ,has used chewing tobacco which she quit in June 2024.  She was initially seen on 15 January 2024.  HPI Discussed the use of AI scribe software for clinical note transcription with the patient, who gave verbal consent to proceed.  History of Present Illness   Tammy Stevenson is a 73 year old female with asthma who presents for follow-up.  Her asthma symptoms, including a persistent cough, have improved with the use of her inhaler. She has not needed to use her rescue inhaler. She is currently using one puff of her inhaler daily.  Previous lung function tests showed significant bronchodilator response consistent with asthma. Recent CT scan revealed mild bronchiectasis, but no scarring was observed in the lungs.  No history of smoking. She has not received a flu vaccine t.       Review of Systems A 10 point review of systems was performed and it is as noted above otherwise negative.   Patient Active Problem List   Diagnosis Date Noted   Persistent cough 08/01/2023   Follow-up exam, less than 3 months since previous exam 05/12/2023   Chronic kidney disease, stage 2, mildly decreased GFR 05/12/2023   Lip lesion 02/04/2023   Unintentional weight loss 02/04/2023   Chronic obstructive pulmonary disease (COPD) (HCC) 05/27/2022   Hyperlipidemia associated with type 2 diabetes mellitus (HCC) 05/27/2022   Hypertension associated with diabetes (HCC) 05/27/2022   Tobacco use disorder 05/27/2022   Screening mammogram for breast cancer 05/27/2022   Uncontrolled type 2  diabetes mellitus with hyperglycemia (HCC) 05/27/2022   Onychomycosis 05/27/2022   Bilateral hearing loss 07/10/2015    Social History   Tobacco Use   Smoking status: Never    Passive exposure: Past   Smokeless tobacco: Former    Types: Chew    Quit date: 10/2022   Tobacco comments:    Pt chews tobacco quit 2024  Substance Use Topics   Alcohol use: No    Alcohol/week: 0.0 standard drinks of alcohol    Allergies  Allergen Reactions   Azithromycin Hives and Rash    Current Meds  Medication Sig   albuterol  (PROAIR  HFA) 108 (90 Base) MCG/ACT inhaler Inhale 2 puffs into the lungs every 6 (six) hours as needed for wheezing or shortness of breath.   cetirizine (ZYRTEC) 10 MG chewable tablet Chew 10 mg by mouth daily.   colesevelam  (WELCHOL ) 625 MG tablet Take 3 tablets (1,875 mg total) by mouth 2 (two) times daily with a meal.   empagliflozin  (JARDIANCE ) 10 MG TABS tablet Take 1 tablet (10 mg total) by mouth daily.   Fluticasone -Umeclidin-Vilant (TRELEGY ELLIPTA ) 200-62.5-25 MCG/ACT AEPB Inhale 1 puff into the lungs daily.   hydrochlorothiazide  (HYDRODIURIL ) 12.5 MG tablet Take 1 tablet (12.5 mg total) by mouth daily.   ipratropium-albuterol  (DUONEB) 0.5-2.5 (3) MG/3ML SOLN Take 3 mLs by nebulization every 6 (six) hours as needed.   losartan  (COZAAR ) 100 MG tablet Take 1 tablet (100 mg total) by mouth daily.   metFORMIN  (GLUCOPHAGE -XR) 500 MG 24 hr tablet Take 2 tablets (  1,000 mg total) by mouth 2 (two) times daily with a meal.   montelukast  (SINGULAIR ) 10 MG tablet Take 1 tablet (10 mg total) by mouth at bedtime.   rosuvastatin  (CRESTOR ) 10 MG tablet Take 1 tablet (10 mg total) by mouth daily.   Spacer/Aero-Holding Chambers (AEROCHAMBER HOLDING CHAMBER) DEVI 1 each by Does not apply route once for 1 dose.    Immunization History  Administered Date(s) Administered   Pneumococcal Conjugate-13 01/16/2017   Pneumococcal Polysaccharide-23 03/20/2018   Tdap 01/16/2017         Objective:     BP 138/60   Pulse 70   Temp 97.7 F (36.5 C) (Temporal)   Ht 5' 4 (1.626 m)   Wt 167 lb 12.8 oz (76.1 kg)   SpO2 96%   BMI 28.80 kg/m   SpO2: 96 %  GENERAL: Elderly woman, well-nourished, fully ambulatory, no conversational dyspnea, very hard of hearing. HEAD: Normocephalic, atraumatic.  Wears hearing aids. EYES: Pupils equal, round, reactive to light.  No scleral icterus.  MOUTH: Edentulous, oral mucosa dry, no thrush. NECK: Supple. No thyromegaly. Trachea midline. No JVD.  No adenopathy. PULMONARY: Good air entry bilaterally.  Coarse, scattered wheezes and rhonchi noted. CARDIOVASCULAR: S1 and S2. Regular rate and rhythm.  No rubs, murmurs or gallops heard. ABDOMEN: Benign. MUSCULOSKELETAL: No joint deformity, no clubbing, no edema.  NEUROLOGIC: Very hard of hearing.  No gait disturbance noted.  No localizing signs. SKIN: Intact,warm,dry. PSYCH: Mood and behavior appropriate.  Assessment & Plan:     ICD-10-CM   1. Moderate persistent asthma without complication  J45.40     2. Bronchiectasis without complication (HCC)  J47.9     3. Chronic cough - RESOLVED  R05.3    Asthma cough variant Cough resolved with BD     Discussion:    Asthma, cough variant Tammy Stevenson has significant asthma with lung function tests showing significant bronchodilator response consistent with asthma. Persistent cough is due to bronchial hyperreactivity. Mild bronchial tube enlargement is noted, likely from past inadequate treatment and infection, without lung fibrosis. She has not required a rescue inhaler, indicating well-controlled symptoms. - Continue daily inhaler use, Trelegy Ellipta  200, one puff per day - Schedule follow-up appointment in four months  Influenza vaccination refusal Tammy Stevenson declined the influenza vaccination, stating she has never had one before.    Advised if symptoms do not improve or worsen, to please contact office for sooner follow up  or seek emergency care.    I spent 22 minutes of dedicated to the care of this patient on the date of this encounter to include pre-visit review of records, face-to-face time with the patient discussing conditions above, post visit ordering of testing, clinical documentation with the electronic health record, making appropriate referrals as documented, and communicating necessary findings to members of the patients care team.     C. Leita Sanders, MD Advanced Bronchoscopy [facility]    *This note was generated using voice recognition software/Dragon and/or AI transcription program.  Despite best efforts to proofread, errors can occur which can change the meaning. Any transcriptional errors that result from this process are unintentional and may not be fully corrected at the time of dictation.

## 2024-03-10 ENCOUNTER — Encounter: Payer: Self-pay | Admitting: Pulmonary Disease

## 2024-04-19 ENCOUNTER — Other Ambulatory Visit: Payer: Self-pay | Admitting: Family Medicine

## 2024-04-19 DIAGNOSIS — E1169 Type 2 diabetes mellitus with other specified complication: Secondary | ICD-10-CM

## 2024-04-19 DIAGNOSIS — E1159 Type 2 diabetes mellitus with other circulatory complications: Secondary | ICD-10-CM

## 2024-04-30 ENCOUNTER — Telehealth: Payer: Self-pay

## 2024-04-30 MED ORDER — TRELEGY ELLIPTA 200-62.5-25 MCG/ACT IN AEPB: 1.0000 | INHALATION_SPRAY | Freq: Every day | RESPIRATORY_TRACT | Status: AC

## 2024-04-30 NOTE — Telephone Encounter (Signed)
 I have left a Trelegy sample at the front desk for the patient. I spoke with her daughter (DPR) and she will come pick it up.  Nothing further needed.

## 2024-04-30 NOTE — Telephone Encounter (Signed)
 Copied from CRM #[id number]. Topic: Clinical - Prescription Issue >> Apr 30, 2024  1:26 PM [name] wrote: Reason for CRM: Patient's daughter is stating that there has been a data processing manager in [employer] system, states patient has not been insured since 2018. [facility] is refusing to fill rx due to this glitch. Patient's daughter would like to know if we have a sample we can provide or a significant coupon card and a way to make the pharmacy fill it.   C/B for Tammy Stevenson is at [phone]. Tammy Stevenson is Tammy Stevenson, Tammy is her middle name.  Routing to [employer].

## 2024-05-06 ENCOUNTER — Other Ambulatory Visit: Payer: Self-pay | Admitting: Family Medicine

## 2024-05-06 DIAGNOSIS — E1159 Type 2 diabetes mellitus with other circulatory complications: Secondary | ICD-10-CM

## 2024-05-17 ENCOUNTER — Ambulatory Visit: Admitting: Family Medicine
